# Patient Record
Sex: Male | Born: 1935 | Race: White | Hispanic: No | Smoking: Former smoker
Health system: Southern US, Community
[De-identification: ages and names within clinical notes are randomized; demographics above are authoritative.]

## PROBLEM LIST (undated history)

## (undated) DIAGNOSIS — C189 Malignant neoplasm of colon, unspecified: Secondary | ICD-10-CM

## (undated) DIAGNOSIS — K589 Irritable bowel syndrome without diarrhea: Secondary | ICD-10-CM

## (undated) DIAGNOSIS — L570 Actinic keratosis: Secondary | ICD-10-CM

## (undated) DIAGNOSIS — I10 Essential (primary) hypertension: Secondary | ICD-10-CM

## (undated) DIAGNOSIS — M542 Cervicalgia: Secondary | ICD-10-CM

## (undated) DIAGNOSIS — M722 Plantar fascial fibromatosis: Secondary | ICD-10-CM

## (undated) DIAGNOSIS — K219 Gastro-esophageal reflux disease without esophagitis: Secondary | ICD-10-CM

## (undated) HISTORY — PX: TONSILLECTOMY: SUR1361

## (undated) HISTORY — PX: CARPAL TUNNEL RELEASE: SHX101

## (undated) HISTORY — PX: APPENDECTOMY: SHX54

## (undated) HISTORY — PX: COLON SURGERY: SHX602

## (undated) HISTORY — PX: CHOLECYSTECTOMY: SHX55

## (undated) HISTORY — PX: COLON RESECTION: SHX5231

---

## 2007-12-21 ENCOUNTER — Observation Stay: Payer: Self-pay | Admitting: Internal Medicine

## 2007-12-21 ENCOUNTER — Other Ambulatory Visit: Payer: Self-pay

## 2009-01-03 ENCOUNTER — Ambulatory Visit: Payer: Self-pay | Admitting: Internal Medicine

## 2009-11-11 ENCOUNTER — Emergency Department: Payer: Self-pay | Admitting: Internal Medicine

## 2011-02-17 ENCOUNTER — Inpatient Hospital Stay: Payer: Self-pay | Admitting: Surgery

## 2011-03-27 ENCOUNTER — Emergency Department: Payer: Self-pay | Admitting: Internal Medicine

## 2011-12-23 ENCOUNTER — Ambulatory Visit: Payer: Self-pay | Admitting: Gastroenterology

## 2011-12-26 LAB — PATHOLOGY REPORT

## 2013-06-24 DIAGNOSIS — C44320 Squamous cell carcinoma of skin of unspecified parts of face: Secondary | ICD-10-CM | POA: Insufficient documentation

## 2014-01-03 ENCOUNTER — Observation Stay: Payer: Self-pay | Admitting: Internal Medicine

## 2014-01-03 LAB — CBC WITH DIFFERENTIAL/PLATELET
BASOS PCT: 0.7 %
Basophil #: 0 10*3/uL (ref 0.0–0.1)
EOS PCT: 3.9 %
Eosinophil #: 0.2 10*3/uL (ref 0.0–0.7)
HCT: 45.3 % (ref 40.0–52.0)
HGB: 15.2 g/dL (ref 13.0–18.0)
LYMPHS ABS: 1.8 10*3/uL (ref 1.0–3.6)
Lymphocyte %: 31.1 %
MCH: 33.4 pg (ref 26.0–34.0)
MCHC: 33.7 g/dL (ref 32.0–36.0)
MCV: 99 fL (ref 80–100)
Monocyte #: 0.5 x10 3/mm (ref 0.2–1.0)
Monocyte %: 8.8 %
Neutrophil #: 3.2 10*3/uL (ref 1.4–6.5)
Neutrophil %: 55.5 %
Platelet: 145 10*3/uL — ABNORMAL LOW (ref 150–440)
RBC: 4.57 10*6/uL (ref 4.40–5.90)
RDW: 13.3 % (ref 11.5–14.5)
WBC: 5.8 10*3/uL (ref 3.8–10.6)

## 2014-01-03 LAB — BASIC METABOLIC PANEL
Anion Gap: 7 (ref 7–16)
BUN: 13 mg/dL (ref 7–18)
Calcium, Total: 9.5 mg/dL (ref 8.5–10.1)
Chloride: 101 mmol/L (ref 98–107)
Co2: 26 mmol/L (ref 21–32)
Creatinine: 1.34 mg/dL — ABNORMAL HIGH (ref 0.60–1.30)
EGFR (African American): 54 — ABNORMAL LOW
GFR CALC NON AF AMER: 47 — AB
Glucose: 97 mg/dL (ref 65–99)
OSMOLALITY: 268 (ref 275–301)
Potassium: 4 mmol/L (ref 3.5–5.1)
Sodium: 134 mmol/L — ABNORMAL LOW (ref 136–145)

## 2014-01-03 LAB — URINALYSIS, COMPLETE
Bacteria: NONE SEEN
Bilirubin,UR: NEGATIVE
Blood: NEGATIVE
GLUCOSE, UR: NEGATIVE mg/dL (ref 0–75)
Ketone: NEGATIVE
Leukocyte Esterase: NEGATIVE
Nitrite: NEGATIVE
PROTEIN: NEGATIVE
Ph: 8 (ref 4.5–8.0)
SPECIFIC GRAVITY: 1.011 (ref 1.003–1.030)
Squamous Epithelial: <1
WBC UR: NONE SEEN /HPF (ref 0–5)

## 2014-01-03 LAB — TROPONIN I

## 2014-01-04 LAB — BASIC METABOLIC PANEL
Anion Gap: 6 — ABNORMAL LOW (ref 7–16)
BUN: 14 mg/dL (ref 7–18)
CALCIUM: 9.5 mg/dL (ref 8.5–10.1)
Chloride: 103 mmol/L (ref 98–107)
Co2: 26 mmol/L (ref 21–32)
Creatinine: 1.23 mg/dL (ref 0.60–1.30)
EGFR (Non-African Amer.): 52 — ABNORMAL LOW
GFR CALC AF AMER: 60 — AB
Glucose: 96 mg/dL (ref 65–99)
Osmolality: 270 (ref 275–301)
POTASSIUM: 4.1 mmol/L (ref 3.5–5.1)
Sodium: 135 mmol/L — ABNORMAL LOW (ref 136–145)

## 2014-01-04 LAB — CBC WITH DIFFERENTIAL/PLATELET
Basophil #: 0 10*3/uL (ref 0.0–0.1)
Basophil %: 0.5 %
Eosinophil #: 0.2 10*3/uL (ref 0.0–0.7)
Eosinophil %: 5.2 %
HCT: 42.4 % (ref 40.0–52.0)
HGB: 14.6 g/dL (ref 13.0–18.0)
Lymphocyte #: 1.4 10*3/uL (ref 1.0–3.6)
Lymphocyte %: 28.9 %
MCH: 34.1 pg — AB (ref 26.0–34.0)
MCHC: 34.5 g/dL (ref 32.0–36.0)
MCV: 99 fL (ref 80–100)
MONOS PCT: 9.5 %
Monocyte #: 0.4 x10 3/mm (ref 0.2–1.0)
Neutrophil #: 2.6 10*3/uL (ref 1.4–6.5)
Neutrophil %: 55.9 %
Platelet: 139 10*3/uL — ABNORMAL LOW (ref 150–440)
RBC: 4.29 10*6/uL — ABNORMAL LOW (ref 4.40–5.90)
RDW: 13.1 % (ref 11.5–14.5)
WBC: 4.7 10*3/uL (ref 3.8–10.6)

## 2014-01-04 LAB — LIPID PANEL
Cholesterol: 101 mg/dL (ref 0–200)
HDL: 21 mg/dL — AB (ref 40–60)
LDL CHOLESTEROL, CALC: 27 mg/dL (ref 0–100)
Triglycerides: 267 mg/dL — ABNORMAL HIGH (ref 0–200)
VLDL CHOLESTEROL, CALC: 53 mg/dL — AB (ref 5–40)

## 2014-01-04 LAB — TSH: Thyroid Stimulating Horm: 1.27 u[IU]/mL

## 2014-01-07 DIAGNOSIS — G459 Transient cerebral ischemic attack, unspecified: Secondary | ICD-10-CM | POA: Insufficient documentation

## 2014-01-30 ENCOUNTER — Emergency Department: Payer: Self-pay | Admitting: Internal Medicine

## 2014-01-30 LAB — COMPREHENSIVE METABOLIC PANEL
ALBUMIN: 3.2 g/dL — AB (ref 3.4–5.0)
ALK PHOS: 99 U/L
ALT: 23 U/L (ref 12–78)
Anion Gap: 6 — ABNORMAL LOW (ref 7–16)
BUN: 18 mg/dL (ref 7–18)
Bilirubin,Total: 0.5 mg/dL (ref 0.2–1.0)
CHLORIDE: 101 mmol/L (ref 98–107)
Calcium, Total: 9.4 mg/dL (ref 8.5–10.1)
Co2: 26 mmol/L (ref 21–32)
Creatinine: 1.28 mg/dL (ref 0.60–1.30)
EGFR (African American): 57 — ABNORMAL LOW
EGFR (Non-African Amer.): 49 — ABNORMAL LOW
Glucose: 104 mg/dL — ABNORMAL HIGH (ref 65–99)
OSMOLALITY: 269 (ref 275–301)
Potassium: 4.4 mmol/L (ref 3.5–5.1)
SGOT(AST): 22 U/L (ref 15–37)
SODIUM: 133 mmol/L — AB (ref 136–145)
Total Protein: 7.3 g/dL (ref 6.4–8.2)

## 2014-01-30 LAB — CBC
HCT: 42.3 % (ref 40.0–52.0)
HGB: 14.3 g/dL (ref 13.0–18.0)
MCH: 32.9 pg (ref 26.0–34.0)
MCHC: 33.8 g/dL (ref 32.0–36.0)
MCV: 97 fL (ref 80–100)
Platelet: 142 10*3/uL — ABNORMAL LOW (ref 150–440)
RBC: 4.35 10*6/uL — AB (ref 4.40–5.90)
RDW: 12.4 % (ref 11.5–14.5)
WBC: 5.4 10*3/uL (ref 3.8–10.6)

## 2014-01-30 LAB — TROPONIN I

## 2014-01-30 LAB — CK TOTAL AND CKMB (NOT AT ARMC)
CK, TOTAL: 47 U/L
CK-MB: 0.8 ng/mL (ref 0.5–3.6)

## 2014-02-21 DIAGNOSIS — R42 Dizziness and giddiness: Secondary | ICD-10-CM | POA: Insufficient documentation

## 2014-02-21 DIAGNOSIS — N289 Disorder of kidney and ureter, unspecified: Secondary | ICD-10-CM | POA: Insufficient documentation

## 2014-03-04 DIAGNOSIS — R251 Tremor, unspecified: Secondary | ICD-10-CM | POA: Insufficient documentation

## 2014-12-17 NOTE — H&P (Signed)
PATIENT NAME:  Kevin Rush, Kevin Rush MR#:  [id number] DATE OF BIRTH:  06/25/1923  DATE OF ADMISSION:  01/03/2014  PRIMARY CARE PHYSICIAN: Leonie Douglas. Doy Hutching, MD  REFERRING EMERGENCY ROOM PHYSICIAN: Verdia Kuba. Paduchowski, MD  CHIEF COMPLAINT:  Dizziness.   HISTORY OF PRESENT ILLNESS: This is 81 year old male with a past medical history of gastroesophageal reflux disease, colon cancer, status post colostomy, hypertension, who lives at a retirement community, [place], with his wife. Today, morning, he felt some dizzy when he woke up at around 5 or 6.  He was planning to get on the treadmill as per his schedule 3 times per week but because of this dizziness he just went back to bed and slept for maybe a few more hours. After that, he wanted to go to the bathroom so wife helped him to go to the bathroom because of the slight dizziness when he was trying to get up on the commode. He felt more dizzy and almost passed out and slumped to the floor. He did not fall down or hit his head anywhere. Wife was there accompanying him and he never lost his consciousness. He was alert and oriented and knowing where he is and what happened. On further questioning, he says that his head was feeling like spinning. He felt somewhat nauseated also at that time but denies any palpitation or chest pain. He denies any headache, numbness or weakness in the limbs. He did not have a similar episode in the past. He denies any feeling of diarrhea or dehydration on working out in the last 1 or 2 days. Brought to the Emergency Room. CT scan of the head was negative so referred her for admission to hospitalist team.   REVIEW OF SYSTEMS:    CONSTITUTIONAL: Negative for fever, fatigue, weakness, pain or weight loss.  EYES: No blurring, double vision, discharge or redness.  EARS, NOSE, THROAT: No tinnitus, ear pain or hearing loss.  RESPIRATORY: No cough, wheezing, hemoptysis or shortness of breath.  CARDIOVASCULAR: No chest pain,  orthopnea, edema, arrhythmia or palpitations.  GASTROINTESTINAL:  Felt some nausea but no vomiting, diarrhea, abdominal pain.  GENITOURINARY: No dysuria, dysuria, hematuria or increased frequency.  ENDOCRINE: No heat or cold intolerance. No increased sweating.  SKIN: No acne, rashes or lesions.  MUSCULOSKELETAL: No pain or swelling in the joints.  NEUROLOGICAL: No numbness, weakness, tremor but the patient had an episode of head spinning and near-syncopal episode.  PSYCHIATRIC: No anxiety, insomnia, bipolar disorder.   PAST MEDICAL HISTORY: 1.  Gastroesophageal reflux disease.  2.  Colon cancer, status post colostomy.  3.  Hypertension.   SOCIAL HISTORY:  1.  He has not smoked in more than 60 years in the past.  2.  Drinks alcohol regularly.  3.  Is retired, living with his wife in a retirement community at [place].   FAMILY HISTORY: Mother had intestinal cancer. No diabetes history in the family.   HOME MEDICATIONS: 1.  Prilosec 20 mg daily.  2.  Nortriptyline 25 mg at nighttime.  3.  Cholestyramine 1 packet daily.  4.  Metoprolol 25 mg tablet, take 1/2 tablet orally daily.  5.  Aspirin 81 mg p.o. daily.   PHYSICAL EXAMINATION: VITAL SIGNS: In ER, temperature 97.6, pulse 78, respirations 18, blood pressure 145/82, pulse oximetry is 96% on room air.  GENERAL: The patient is fully alert and oriented to time, place and person. Does not appear in any acute distress.  HEENT: Head and neck  atraumatic. Conjunctivae pink. Oral mucosa moist. Neck supple. Hearing aid present in bilateral ears. No JVD.  RESPIRATORY: Bilateral equal and clear air entry.  CARDIOVASCULAR: S1, S2 present, regular. No murmur.  ABDOMEN: Soft, nontender. Bowel sounds present. No organomegaly.  SKIN: No rashes.  LEGS:  No edema.  NEUROLOGICAL: Power 5 out of 5, follows commands, moves all 4 limbs.  PSYCHIATRIC: Does not appear in any acute psychiatric illness.  JOINTS: No swelling or tenderness.    LABORATORY, DIAGNOSTIC AND RADIOLOGICAL DATA:  Glucose 97, BUN is 13, creatine 1.34, sodium 134, potassium is 4.0, chloride is 101, CO2 is 26, calcium is 9.5. Troponin less than 0.02. WBC 5.8, hemoglobin 15.2, platelet count is 145, MCV is 99. CT scan of the head without contrast no acute intracranial abnormality.   ASSESSMENT AND PLAN: An 81 year old male with past medical history of gastroesophageal reflux disease, colon cancer and hypertension, came with a feeling of dizziness today morning. CT scan of the head is negative.  1.  Transient ischemic attack. Most likely this is a transient ischemic attack episode as currently the patient does not feel having any symptoms and on physical exam he does not have any findings. We will admit to medical floor with telemetry monitoring.  Get MRI of the brain and carotid Doppler study to rule out underlying cause and echocardiogram for any cardiac abnormalities.  2.  Hypertension. Meanwhile, we will continue metoprolol.  3.  Gastroesophageal reflux disease. Continue his medication as Prilosec, home.  4.  Slightly worsened renal function, creatinine 1.34.  Will give IV hydration and monitor it tomorrow.  7.  CODE STATUS: Full code.   TOTAL TIME SPENT ON THIS ADMISSION: 50 minutes.    ____________________________ Ceasar Lund Anselm Jungling, MD vgv:cs D: 01/03/2014 15:18:31 ET T: 01/03/2014 15:41:18 ET JOB#: [id number]  cc: Ceasar Lund. Anselm Jungling, MD, <Dictator> Leonie Douglas. Doy Hutching, MD Vaughan Basta MD ELECTRONICALLY SIGNED 01/18/2014 16:26

## 2014-12-19 ENCOUNTER — Emergency Department
Admit: 2014-12-19 | Disposition: A | Discharge status: Home / Self Care | Payer: Self-pay | Admitting: Emergency Medicine

## 2014-12-19 LAB — URINALYSIS, COMPLETE
BACTERIA: NONE SEEN
BLOOD: NEGATIVE
Bilirubin,UR: NEGATIVE
Glucose,UR: NEGATIVE mg/dL (ref 0–75)
Ketone: NEGATIVE
Leukocyte Esterase: NEGATIVE
Nitrite: NEGATIVE
PROTEIN: NEGATIVE
Ph: 8 (ref 4.5–8.0)
RBC,UR: NONE SEEN /HPF (ref 0–5)
SPECIFIC GRAVITY: 1.008 (ref 1.003–1.030)

## 2014-12-19 LAB — COMPREHENSIVE METABOLIC PANEL WITH GFR
Albumin: 4.1 g/dL
Alkaline Phosphatase: 72 U/L
Anion Gap: 9
BUN: 17 mg/dL
Bilirubin,Total: 0.8 mg/dL
Calcium, Total: 10.2 mg/dL
Chloride: 103 mmol/L
Co2: 25 mmol/L
Creatinine: 1.24 mg/dL
EGFR (African American): 59 — ABNORMAL LOW
EGFR (Non-African Amer.): 51 — ABNORMAL LOW
Glucose: 106 mg/dL — ABNORMAL HIGH
Potassium: 4.2 mmol/L
SGOT(AST): 24 U/L
SGPT (ALT): 17 U/L
Sodium: 137 mmol/L
Total Protein: 7.5 g/dL

## 2014-12-19 LAB — PROTIME-INR
INR: 1
Prothrombin Time: 13 secs

## 2014-12-19 LAB — CBC
HCT: 41.8 %
HGB: 14.4 g/dL
MCH: 34.2 pg — ABNORMAL HIGH
MCHC: 34.4 g/dL
MCV: 99 fL
Platelet: 159 10*3/uL
RBC: 4.22 x10 6/mm 3 — ABNORMAL LOW
RDW: 12.9 %
WBC: 5.7 10*3/uL

## 2014-12-19 LAB — APTT: ACTIVATED PTT: 34.6 s (ref 23.6–35.9)

## 2014-12-19 LAB — TROPONIN I: Troponin-I: <0.03 ng/mL

## 2015-01-17 ENCOUNTER — Other Ambulatory Visit: Payer: Self-pay | Admitting: Ophthalmology

## 2015-01-17 DIAGNOSIS — H4710 Unspecified papilledema: Secondary | ICD-10-CM

## 2015-01-26 ENCOUNTER — Ambulatory Visit: Admission: RE | Admit: 2015-01-26 | Payer: Medicare Other | Source: Ambulatory Visit

## 2015-01-26 ENCOUNTER — Ambulatory Visit
Admission: RE | Admit: 2015-01-26 | Discharge: 2015-01-26 | Disposition: A | Discharge status: Home / Self Care | Payer: Medicare Other | Source: Ambulatory Visit | Attending: Ophthalmology | Admitting: Ophthalmology

## 2015-01-26 DIAGNOSIS — H47092 Other disorders of optic nerve, not elsewhere classified, left eye: Secondary | ICD-10-CM | POA: Diagnosis not present

## 2015-01-26 DIAGNOSIS — H4710 Unspecified papilledema: Secondary | ICD-10-CM | POA: Insufficient documentation

## 2015-01-26 DIAGNOSIS — H547 Unspecified visual loss: Secondary | ICD-10-CM | POA: Insufficient documentation

## 2015-01-26 MED ORDER — GADOBENATE DIMEGLUMINE 529 MG/ML IV SOLN
15.0000 mL | Freq: Once | INTRAVENOUS | Status: AC | PRN
  Administered 2015-01-26: 13 mL via INTRAVENOUS

## 2015-01-27 ENCOUNTER — Other Ambulatory Visit
Admission: RE | Admit: 2015-01-27 | Discharge: 2015-01-27 | Disposition: A | Discharge status: Home / Self Care | Payer: Medicare Other | Source: Ambulatory Visit | Attending: Ophthalmology | Admitting: Ophthalmology

## 2015-01-27 DIAGNOSIS — H4710 Unspecified papilledema: Secondary | ICD-10-CM | POA: Diagnosis present

## 2015-01-27 LAB — CBC WITH DIFFERENTIAL/PLATELET
Basophils Absolute: 0 10*3/uL (ref 0–0.1)
Basophils Relative: 1 %
EOS ABS: 0.2 10*3/uL (ref 0–0.7)
EOS PCT: 5 %
HEMATOCRIT: 37.1 % — AB (ref 40.0–52.0)
HEMOGLOBIN: 12.4 g/dL — AB (ref 13.0–18.0)
Lymphocytes Relative: 27 %
Lymphs Abs: 1.4 10*3/uL (ref 1.0–3.6)
MCH: 33.8 pg (ref 26.0–34.0)
MCHC: 33.5 g/dL (ref 32.0–36.0)
MCV: 100.8 fL — ABNORMAL HIGH (ref 80.0–100.0)
Monocytes Absolute: 0.4 10*3/uL (ref 0.2–1.0)
Monocytes Relative: 8 %
NEUTROS PCT: 59 %
Neutro Abs: 3 10*3/uL (ref 1.4–6.5)
Platelets: 147 10*3/uL — ABNORMAL LOW (ref 150–440)
RBC: 3.68 MIL/uL — ABNORMAL LOW (ref 4.40–5.90)
RDW: 13.4 % (ref 11.5–14.5)
WBC: 5.1 10*3/uL (ref 3.8–10.6)

## 2015-01-27 LAB — SEDIMENTATION RATE: SED RATE: 27 mm/h — AB (ref 0–20)

## 2015-01-27 LAB — C-REACTIVE PROTEIN: CRP: 0.8 mg/dL (ref <1.0)

## 2015-02-21 DIAGNOSIS — E538 Deficiency of other specified B group vitamins: Secondary | ICD-10-CM | POA: Insufficient documentation

## 2015-03-15 ENCOUNTER — Other Ambulatory Visit
Admission: RE | Admit: 2015-03-15 | Discharge: 2015-03-15 | Disposition: A | Discharge status: Home / Self Care | Payer: Medicare Other | Source: Ambulatory Visit | Attending: Ophthalmology | Admitting: Ophthalmology

## 2015-03-15 DIAGNOSIS — H469 Unspecified optic neuritis: Secondary | ICD-10-CM | POA: Diagnosis present

## 2015-03-15 LAB — CBC WITH DIFFERENTIAL/PLATELET
BASOS ABS: 0 10*3/uL (ref 0–0.1)
Basophils Relative: 1 %
EOS ABS: 0.2 10*3/uL (ref 0–0.7)
EOS PCT: 4 %
HCT: 38.8 % — ABNORMAL LOW (ref 40.0–52.0)
HEMOGLOBIN: 13.2 g/dL (ref 13.0–18.0)
LYMPHS ABS: 1.4 10*3/uL (ref 1.0–3.6)
LYMPHS PCT: 25 %
MCH: 33.6 pg (ref 26.0–34.0)
MCHC: 34.1 g/dL (ref 32.0–36.0)
MCV: 98.5 fL (ref 80.0–100.0)
Monocytes Absolute: 0.4 10*3/uL (ref 0.2–1.0)
Monocytes Relative: 7 %
NEUTROS PCT: 63 %
Neutro Abs: 3.5 10*3/uL (ref 1.4–6.5)
PLATELETS: 149 10*3/uL — AB (ref 150–440)
RBC: 3.94 MIL/uL — ABNORMAL LOW (ref 4.40–5.90)
RDW: 12.6 % (ref 11.5–14.5)
WBC: 5.5 10*3/uL (ref 3.8–10.6)

## 2015-03-15 LAB — SEDIMENTATION RATE: SED RATE: 20 mm/h (ref 0–20)

## 2015-03-15 LAB — C-REACTIVE PROTEIN: CRP: 0.7 mg/dL (ref <1.0)

## 2015-06-14 DIAGNOSIS — H469 Unspecified optic neuritis: Secondary | ICD-10-CM | POA: Insufficient documentation

## 2015-09-25 ENCOUNTER — Encounter: Payer: Self-pay | Admitting: Emergency Medicine

## 2015-09-25 ENCOUNTER — Emergency Department: Payer: Medicare Other

## 2015-09-25 ENCOUNTER — Inpatient Hospital Stay: Payer: Medicare Other

## 2015-09-25 ENCOUNTER — Inpatient Hospital Stay
Admission: EM | Admit: 2015-09-25 | Discharge: 2015-09-27 | DRG: 202 | Disposition: A | Discharge status: Home / Self Care | Payer: Medicare Other | Attending: Internal Medicine | Admitting: Internal Medicine

## 2015-09-25 DIAGNOSIS — Z7982 Long term (current) use of aspirin: Secondary | ICD-10-CM

## 2015-09-25 DIAGNOSIS — J45909 Unspecified asthma, uncomplicated: Secondary | ICD-10-CM | POA: Diagnosis present

## 2015-09-25 DIAGNOSIS — J8410 Pulmonary fibrosis, unspecified: Secondary | ICD-10-CM | POA: Diagnosis present

## 2015-09-25 DIAGNOSIS — R0602 Shortness of breath: Secondary | ICD-10-CM

## 2015-09-25 DIAGNOSIS — Z8701 Personal history of pneumonia (recurrent): Secondary | ICD-10-CM | POA: Diagnosis not present

## 2015-09-25 DIAGNOSIS — Z800 Family history of malignant neoplasm of digestive organs: Secondary | ICD-10-CM | POA: Diagnosis not present

## 2015-09-25 DIAGNOSIS — Z87891 Personal history of nicotine dependence: Secondary | ICD-10-CM | POA: Diagnosis not present

## 2015-09-25 DIAGNOSIS — J209 Acute bronchitis, unspecified: Secondary | ICD-10-CM | POA: Diagnosis present

## 2015-09-25 DIAGNOSIS — F0390 Unspecified dementia without behavioral disturbance: Secondary | ICD-10-CM | POA: Diagnosis present

## 2015-09-25 DIAGNOSIS — I10 Essential (primary) hypertension: Secondary | ICD-10-CM | POA: Diagnosis present

## 2015-09-25 DIAGNOSIS — Z79899 Other long term (current) drug therapy: Secondary | ICD-10-CM

## 2015-09-25 DIAGNOSIS — K219 Gastro-esophageal reflux disease without esophagitis: Secondary | ICD-10-CM | POA: Diagnosis present

## 2015-09-25 DIAGNOSIS — Z85038 Personal history of other malignant neoplasm of large intestine: Secondary | ICD-10-CM

## 2015-09-25 DIAGNOSIS — N179 Acute kidney failure, unspecified: Secondary | ICD-10-CM | POA: Diagnosis present

## 2015-09-25 DIAGNOSIS — J40 Bronchitis, not specified as acute or chronic: Secondary | ICD-10-CM | POA: Diagnosis present

## 2015-09-25 DIAGNOSIS — E871 Hypo-osmolality and hyponatremia: Secondary | ICD-10-CM | POA: Diagnosis present

## 2015-09-25 DIAGNOSIS — K589 Irritable bowel syndrome without diarrhea: Secondary | ICD-10-CM | POA: Diagnosis present

## 2015-09-25 DIAGNOSIS — Z23 Encounter for immunization: Secondary | ICD-10-CM

## 2015-09-25 DIAGNOSIS — Z888 Allergy status to other drugs, medicaments and biological substances status: Secondary | ICD-10-CM

## 2015-09-25 DIAGNOSIS — Z9049 Acquired absence of other specified parts of digestive tract: Secondary | ICD-10-CM | POA: Diagnosis not present

## 2015-09-25 DIAGNOSIS — R000 Tachycardia, unspecified: Secondary | ICD-10-CM

## 2015-09-25 HISTORY — DX: Cervicalgia: M54.2

## 2015-09-25 HISTORY — DX: Gastro-esophageal reflux disease without esophagitis: K21.9

## 2015-09-25 HISTORY — DX: Irritable bowel syndrome, unspecified: K58.9

## 2015-09-25 HISTORY — DX: Malignant neoplasm of colon, unspecified: C18.9

## 2015-09-25 HISTORY — DX: Actinic keratosis: L57.0

## 2015-09-25 HISTORY — DX: Plantar fascial fibromatosis: M72.2

## 2015-09-25 HISTORY — DX: Essential (primary) hypertension: I10

## 2015-09-25 LAB — URINALYSIS COMPLETE WITH MICROSCOPIC (ARMC ONLY)
BILIRUBIN URINE: NEGATIVE
Bacteria, UA: NONE SEEN
Glucose, UA: NEGATIVE mg/dL
Hgb urine dipstick: NEGATIVE
Leukocytes, UA: NEGATIVE
NITRITE: NEGATIVE
PH: 5 (ref 5.0–8.0)
Protein, ur: 30 mg/dL — AB
Specific Gravity, Urine: 1.015 (ref 1.005–1.030)
Squamous Epithelial / LPF: NONE SEEN

## 2015-09-25 LAB — BASIC METABOLIC PANEL
Anion gap: 11 (ref 5–15)
BUN: 24 mg/dL — ABNORMAL HIGH (ref 6–20)
CALCIUM: 9.7 mg/dL (ref 8.9–10.3)
CO2: 21 mmol/L — ABNORMAL LOW (ref 22–32)
Chloride: 98 mmol/L — ABNORMAL LOW (ref 101–111)
Creatinine, Ser: 1.79 mg/dL — ABNORMAL HIGH (ref 0.61–1.24)
GFR, EST AFRICAN AMERICAN: 37 mL/min — AB (ref >60)
GFR, EST NON AFRICAN AMERICAN: 32 mL/min — AB (ref >60)
GLUCOSE: 133 mg/dL — AB (ref 65–99)
Potassium: 3.9 mmol/L (ref 3.5–5.1)
Sodium: 130 mmol/L — ABNORMAL LOW (ref 135–145)

## 2015-09-25 LAB — CBC
HCT: 45.2 % (ref 40.0–52.0)
Hemoglobin: 15.1 g/dL (ref 13.0–18.0)
MCH: 32.2 pg (ref 26.0–34.0)
MCHC: 33.5 g/dL (ref 32.0–36.0)
MCV: 96 fL (ref 80.0–100.0)
Platelets: 154 10*3/uL (ref 150–440)
RBC: 4.71 MIL/uL (ref 4.40–5.90)
RDW: 13.5 % (ref 11.5–14.5)
WBC: 5.3 10*3/uL (ref 3.8–10.6)

## 2015-09-25 LAB — TROPONIN I
Troponin I: 0.03 ng/mL (ref <0.031)
Troponin I: 0.03 ng/mL (ref <0.031)

## 2015-09-25 LAB — FIBRIN DERIVATIVES D-DIMER (ARMC ONLY): Fibrin derivatives D-dimer (ARMC): 1020 — ABNORMAL HIGH (ref 0–499)

## 2015-09-25 MED ORDER — LEVOFLOXACIN IN D5W 500 MG/100ML IV SOLN
500.0000 mg | Freq: Once | INTRAVENOUS | Status: AC
  Administered 2015-09-25: 500 mg via INTRAVENOUS
  Filled 2015-09-25: qty 100

## 2015-09-25 MED ORDER — SODIUM CHLORIDE 0.9 % IV BOLUS (SEPSIS)
1000.0000 mL | Freq: Once | INTRAVENOUS | Status: AC
  Administered 2015-09-25: 1000 mL via INTRAVENOUS

## 2015-09-25 MED ORDER — PNEUMOCOCCAL VAC POLYVALENT 25 MCG/0.5ML IJ INJ
0.5000 mL | INJECTION | INTRAMUSCULAR | Status: AC
  Administered 2015-09-27: 0.5 mL via INTRAMUSCULAR
  Filled 2015-09-25 (×2): qty 0.5

## 2015-09-25 MED ORDER — LEVOFLOXACIN IN D5W 250 MG/50ML IV SOLN
250.0000 mg | INTRAVENOUS | Status: DC
  Filled 2015-09-25: qty 50

## 2015-09-25 MED ORDER — ENOXAPARIN SODIUM 30 MG/0.3ML ~~LOC~~ SOLN
30.0000 mg | SUBCUTANEOUS | Status: DC
  Administered 2015-09-25 – 2015-09-26 (×2): 30 mg via SUBCUTANEOUS
  Filled 2015-09-25 (×2): qty 0.3

## 2015-09-25 MED ORDER — FUROSEMIDE 10 MG/ML IJ SOLN
40.0000 mg | INTRAMUSCULAR | Status: AC
  Administered 2015-09-25: 40 mg via INTRAVENOUS
  Filled 2015-09-25: qty 4

## 2015-09-25 MED ORDER — FUROSEMIDE 10 MG/ML IJ SOLN: 40.0000 mg | Freq: Every day | INTRAMUSCULAR | Status: DC

## 2015-09-25 MED ORDER — ASPIRIN EC 325 MG PO TBEC
325.0000 mg | DELAYED_RELEASE_TABLET | Freq: Every day | ORAL | Status: DC
  Administered 2015-09-25 – 2015-09-27 (×3): 325 mg via ORAL
  Filled 2015-09-25 (×3): qty 1

## 2015-09-25 MED ORDER — FLUDROCORTISONE ACETATE 0.1 MG PO TABS
0.1000 mg | ORAL_TABLET | Freq: Every day | ORAL | Status: DC
  Administered 2015-09-26 – 2015-09-27 (×2): 0.1 mg via ORAL
  Filled 2015-09-25 (×3): qty 1

## 2015-09-25 MED ORDER — IPRATROPIUM-ALBUTEROL 0.5-2.5 (3) MG/3ML IN SOLN
3.0000 mL | RESPIRATORY_TRACT | Status: DC
  Administered 2015-09-26 (×3): 3 mL via RESPIRATORY_TRACT
  Filled 2015-09-25 (×3): qty 3

## 2015-09-25 MED ORDER — PANTOPRAZOLE SODIUM 40 MG PO TBEC
40.0000 mg | DELAYED_RELEASE_TABLET | Freq: Two times a day (BID) | ORAL | Status: DC
Start: 2015-09-25 — End: 2015-09-27
  Administered 2015-09-25 – 2015-09-27 (×4): 40 mg via ORAL
  Filled 2015-09-25 (×4): qty 1

## 2015-09-25 MED ORDER — ONDANSETRON HCL 4 MG/2ML IJ SOLN: 4.0000 mg | Freq: Four times a day (QID) | INTRAMUSCULAR | Status: DC | PRN

## 2015-09-25 MED ORDER — ADULT MULTIVITAMIN W/MINERALS CH
1.0000 | ORAL_TABLET | Freq: Every day | ORAL | Status: DC
  Administered 2015-09-25 – 2015-09-27 (×3): 1 via ORAL
  Filled 2015-09-25 (×5): qty 1

## 2015-09-25 MED ORDER — ACETAMINOPHEN 325 MG PO TABS: 650.0000 mg | ORAL_TABLET | Freq: Four times a day (QID) | ORAL | Status: DC | PRN

## 2015-09-25 MED ORDER — POLYETHYLENE GLYCOL 3350 17 G PO PACK: 17.0000 g | PACK | Freq: Every day | ORAL | Status: DC | PRN

## 2015-09-25 MED ORDER — ACETAMINOPHEN 650 MG RE SUPP: 650.0000 mg | Freq: Four times a day (QID) | RECTAL | Status: DC | PRN

## 2015-09-25 MED ORDER — TECHNETIUM TO 99M ALBUMIN AGGREGATED
4.0660 | Freq: Once | INTRAVENOUS | Status: AC | PRN
  Administered 2015-09-25: 4.066 via INTRAVENOUS

## 2015-09-25 MED ORDER — METHYLPREDNISOLONE SODIUM SUCC 40 MG IJ SOLR
40.0000 mg | Freq: Two times a day (BID) | INTRAMUSCULAR | Status: DC
  Administered 2015-09-25 – 2015-09-27 (×4): 40 mg via INTRAVENOUS
  Filled 2015-09-25 (×4): qty 1

## 2015-09-25 MED ORDER — METOPROLOL SUCCINATE ER 25 MG PO TB24
12.5000 mg | ORAL_TABLET | Freq: Every day | ORAL | Status: DC
  Administered 2015-09-25 – 2015-09-27 (×3): 12.5 mg via ORAL
  Filled 2015-09-25 (×4): qty 1

## 2015-09-25 MED ORDER — CHOLESTYRAMINE 4 G PO PACK
4.0000 g | PACK | Freq: Every day | ORAL | Status: DC
  Administered 2015-09-26 – 2015-09-27 (×2): 4 g via ORAL
  Filled 2015-09-25 (×2): qty 1

## 2015-09-25 MED ORDER — ONDANSETRON HCL 4 MG PO TABS: 4.0000 mg | ORAL_TABLET | Freq: Four times a day (QID) | ORAL | Status: DC | PRN

## 2015-09-25 MED ORDER — TECHNETIUM TC 99M DIETHYLENETRIAME-PENTAACETIC ACID
31.7000 | Freq: Once | INTRAVENOUS | Status: AC | PRN
  Administered 2015-09-25: 31.7 via INTRAVENOUS

## 2015-09-25 MED ORDER — SODIUM CHLORIDE 0.9% FLUSH
3.0000 mL | Freq: Two times a day (BID) | INTRAVENOUS | Status: DC
  Administered 2015-09-25 – 2015-09-27 (×3): 3 mL via INTRAVENOUS

## 2015-09-25 MED ORDER — NORTRIPTYLINE HCL 25 MG PO CAPS
25.0000 mg | ORAL_CAPSULE | Freq: Every day | ORAL | Status: DC
  Filled 2015-09-25 (×2): qty 1

## 2015-09-25 MED ORDER — TIMOLOL MALEATE 0.5 % OP SOLN
1.0000 [drp] | Freq: Two times a day (BID) | OPHTHALMIC | Status: DC
  Administered 2015-09-25 – 2015-09-27 (×3): 1 [drp] via OPHTHALMIC
  Filled 2015-09-25 (×2): qty 5

## 2015-09-25 NOTE — ED Notes (Signed)
Patient transported to X-ray 

## 2015-09-25 NOTE — ED Notes (Signed)
MD at bedside. 

## 2015-09-25 NOTE — ED Notes (Signed)
Patient transported to Ultrasound 

## 2015-09-25 NOTE — ED Provider Notes (Signed)
[facility] Emergency Department Provider Note    ____________________________________________  Time seen: ~1240  I have reviewed the triage vital signs and the nursing notes.   HISTORY  Chief Complaint Nausea   History limited by: Not Limited   HPI Kevin Rush is a 82 y.o. male who presents to the emergency department today because of concerns for fatigue, nausea and diarrhea. Patient states that he has been feeling fatiguedfor the past roughly week. He states that a has not done any extreme exertion or does he have any other explanation for the fatigue. He states that he also has had multiple episodes of diarrhea. Last episode of diarrhea was this morning. He describes it as watery. He did not notice any blood in it. The patient had a syncopal episode last night when he went to use the bathroom. He denied any associated chest pain or palpitations. He denies any fevers.     Past Medical History  Diagnosis Date  . Colon cancer ([place])   . Hypertension     There are no active problems to display for this patient.   History reviewed. No pertinent past surgical history.  No current outpatient prescriptions on file.  Allergies Zithromax  No family history on file.  Social History Social History  Substance Use Topics  . Smoking status: Never Smoker   . Smokeless tobacco: None  . Alcohol Use: No    Review of Systems  Constitutional: Negative for fever. Cardiovascular: Negative for chest pain. Respiratory: Negative for shortness of breath. Gastrointestinal: Negative for abdominal pain, vomiting and diarrhea. Genitourinary: Negative for dysuria. Neurological: Negative for headaches, focal weakness or numbness.  10-point ROS otherwise negative.  ____________________________________________   PHYSICAL EXAM:  VITAL SIGNS: ED Triage Vitals  Enc Vitals Group     BP 09/25/15 1025 91/61 mmHg     Pulse Rate 09/25/15 1025 106     Resp  09/25/15 1025 18     Temp 09/25/15 1025 98.1 F (36.7 C)     Temp Source 09/25/15 1025 Oral     SpO2 09/25/15 1025 97 %     Weight 09/25/15 1025 138 lb (62.596 kg)     Height 09/25/15 1025 5\' 5"  (1.651 m)     Head Cir --      Peak Flow --      Pain Score 09/25/15 1042 0   Constitutional: Alert and oriented. Well appearing and in no distress. Eyes: Conjunctivae are normal. PERRL. Normal extraocular movements. ENT   Head: Normocephalic and atraumatic.   Nose: No congestion/rhinnorhea.   Mouth/Throat: Mucous membranes are moist.   Neck: No stridor. Hematological/Lymphatic/Immunilogical: No cervical lymphadenopathy. Cardiovascular: Normal rate, regular rhythm.  No murmurs, rubs, or gallops. Respiratory: Normal respiratory effort without tachypnea nor retractions. Breath sounds are clear and equal bilaterally. No wheezes/rales/rhonchi. Gastrointestinal: Soft and nontender. No distention. There is no CVA tenderness. Genitourinary: Deferred Musculoskeletal: Normal range of motion in all extremities. No joint effusions.  No lower extremity tenderness nor edema. Neurologic:  Normal speech and language. No gross focal neurologic deficits are appreciated.  Skin:  Skin is warm, dry and intact. No rash noted. Psychiatric: Mood and affect are normal. Speech and behavior are normal. Patient exhibits appropriate insight and judgment.  ____________________________________________    LABS (pertinent positives/negatives)  Trop 0.03 D-Dimer 1020 WBC 5.3 Na 130 Cr 1.79   ____________________________________________   EKG  I, Nance Pear, attending physician, personally viewed and interpreted this EKG  EKG Time: 1029 Rate: 114  Rhythm: sinus tachycardia Axis: normal Intervals: qtc 443 QRS: narrow ST changes: no st elevation Impression: abnormal ekg   ____________________________________________    RADIOLOGY  CXR  IMPRESSION: 1. Lung findings most suggestive  of chronic interstitial lung disease/fibrosis. Without prior exams for comparison, I cannot exclude some degree of superimposed interstitial edema. 2. No evidence of pneumonia. 3. Lungs mildly hyperexpanded suggesting COPD. Suspect associated chronic bronchitic changes centrally.   ____________________________________________   PROCEDURES  Procedure(s) performed: None  Critical Care performed: No  ____________________________________________   INITIAL IMPRESSION / ASSESSMENT AND PLAN / ED COURSE  Pertinent labs & imaging results that were available during my care of the patient were reviewed by me and considered in my medical decision making (see chart for details).  Patient presented to the emergency department for a couple day history of feeling unwell. On exam here patient tachycardic. Patient does have a history of cancer. D-dimer was sent is elevated raising concerns for pulmonary embolism. Patient was also given a liter of fluid given tachycardia however this did not improve. Will plan on admission to the hospital service for further workup and evaluation.  ____________________________________________   FINAL CLINICAL IMPRESSION(S) / ED DIAGNOSES  Final diagnoses:  Shortness of breath  Tachycardia     Nance Pear, MD 09/26/15 1425

## 2015-09-25 NOTE — ED Notes (Signed)
Attempted to call report to 1C; Network engineer states that charge nurse is in a pt room at this time.

## 2015-09-25 NOTE — Progress Notes (Signed)
Pharmacy Antibiotic Note  Kevin Rush is a 82 y.o. male admitted on 09/25/2015 with acute bronchitis.  Pharmacy has been consulted for levofloxacin dosing.  Plan: Levofloxacin 500 mg IV x 1 followed by levofloxacin 250 mg IV Q24H. Pharmacy will continue to follow for IV to PO conversion and to recommend appropriate duration based on clinical response.   Height: 5\' 5"  (165.1 cm) Weight: 138 lb (62.596 kg) IBW/kg (Calculated) : 61.5  Temp (24hrs), Avg:98.3 F (36.8 C), Min:98.1 F (36.7 C), Max:98.4 F (36.9 C)   Recent Labs Lab 09/25/15 1045  WBC 5.3  CREATININE 1.79*    Estimated Creatinine Clearance: 23.9 mL/min (by C-G formula based on Cr of 1.79).    Allergies  Allergen Reactions  . Zithromax [Azithromycin] Anaphylaxis    Thank you for allowing pharmacy to be a part of this patient's care.  Laural Benes, Pharm.D., BCPS Clinical Pharmacist 09/25/2015 9:50 PM

## 2015-09-25 NOTE — ED Notes (Signed)
Pt unable to void at this moment. RN notified of this.

## 2015-09-25 NOTE — H&P (Signed)
[facility] [place] NAME: Kevin Rush    MR#:  [id number]  DATE OF BIRTH:  [birth year]  DATE OF ADMISSION:  09/25/2015  PRIMARY CARE PHYSICIAN: Idelle Crouch, MD   REQUESTING/REFERRING PHYSICIAN: Dr. Delman Kitten  CHIEF COMPLAINT:   Chief Complaint  Patient presents with  . Nausea    HISTORY OF PRESENT ILLNESS:  Kevin Rush  is a 82 y.o. male with a known history of colon cancer in remission, hypertension, GERD who lives at assisted living facility came in after presyncopal episode and weakness and cough. Patient is normally active at baseline, but for the last 3-4 days he's been having extreme weakness, decreased intake due to nausea. Last night he had a presyncopal episode. Denies any chest pain or lightheadedness prior to the episode. This morning he was feeling extremely weak, was having some cough and chills at home so presented to the emergency room. Started to be hyponatremic, mild renal insufficiency and chest x-ray with significant bronchitis changes noted.  PAST MEDICAL HISTORY:   Past Medical History  Diagnosis Date  . Colon cancer ([place])     in remission  . Hypertension   . Actinic keratoses   . GERD (gastroesophageal reflux disease)   . Irritable bowel syndrome   . Cervicalgia   . Plantar fibromatosis     PAST SURGICAL HISTORY:   Past Surgical History  Procedure Laterality Date  . Colon resection    . Appendectomy    . Cholecystectomy      SOCIAL HISTORY:   Social History  Substance Use Topics  . Smoking status: Never Smoker   . Smokeless tobacco: Not on file  . Alcohol Use: 0.0 oz/week    0 Standard drinks or equivalent per week     Comment: drinks canadian whisky every night- none in the last 3 days    FAMILY HISTORY:   Family History  Problem Relation Age of Onset  . Colon cancer Mother     DRUG ALLERGIES:   Allergies  Allergen Reactions  . Zithromax [Azithromycin] Anaphylaxis     REVIEW OF SYSTEMS:   Review of Systems  Constitutional: Positive for chills and malaise/fatigue. Negative for fever and weight loss.  HENT: Negative for ear discharge, ear pain, hearing loss and nosebleeds.   Eyes: Negative for blurred vision, double vision and photophobia.  Respiratory: Positive for shortness of breath. Negative for cough, hemoptysis and wheezing.   Cardiovascular: Negative for chest pain, palpitations, orthopnea and leg swelling.  Gastrointestinal: Positive for nausea. Negative for heartburn, vomiting, abdominal pain, diarrhea, constipation and melena.  Genitourinary: Negative for dysuria, urgency, frequency and hematuria.  Musculoskeletal: Negative for myalgias, back pain and neck pain.  Skin: Negative for rash.  Neurological: Negative for dizziness, tremors, sensory change, speech change, focal weakness and headaches.  Endo/Heme/Allergies: Does not bruise/bleed easily.  Psychiatric/Behavioral: Negative for depression.    MEDICATIONS AT HOME:   Prior to Admission medications   Medication Sig Start Date End Date Taking? Authorizing Provider  aspirin EC 325 MG tablet Take 325 mg by mouth daily.   Yes Historical Provider, MD  cholestyramine (QUESTRAN) 4 g packet Take 4 g by mouth daily before breakfast.    Yes Historical Provider, MD  cyanocobalamin (,VITAMIN B-12,) 1000 MCG/ML injection Inject 1,000 mcg into the muscle every 30 (thirty) days. Pt gets on the first of every month.   Yes Historical Provider, MD  fludrocortisone (FLORINEF) 0.1 MG tablet Take 0.1 mg by mouth  daily.   Yes Historical Provider, MD  metoprolol succinate (TOPROL-XL) 25 MG 24 hr tablet Take 12.5 mg by mouth daily.   Yes Historical Provider, MD  Multiple Vitamin (MULTIVITAMIN WITH MINERALS) TABS tablet Take 1 tablet by mouth daily.   Yes Historical Provider, MD  nortriptyline (PAMELOR) 25 MG capsule Take 25 mg by mouth at bedtime.   Yes Historical Provider, MD  omeprazole (PRILOSEC) 20 MG  capsule Take 20 mg by mouth 2 (two) times daily.   Yes Historical Provider, MD  timolol (TIMOPTIC) 0.5 % ophthalmic solution Place 1 drop into both eyes 2 (two) times daily.   Yes Historical Provider, MD      VITAL SIGNS:  Blood pressure 138/88, pulse 105, temperature 98.1 F (36.7 C), temperature source Oral, resp. rate 20, height 5\' 5"  (1.651 m), weight 62.596 kg (138 lb), SpO2 97 %.  PHYSICAL EXAMINATION:   Physical Exam  GENERAL:  82 y.o.-year-old patient lying in the bed with no acute distress.  EYES: Pupils equal, round, reactive to light and accommodation. No scleral icterus. Extraocular muscles intact.  HEENT: Head atraumatic, normocephalic. Oropharynx and nasopharynx clear.  NECK:  Supple, no jugular venous distention. No thyroid enlargement, no tenderness.  LUNGS: Decreased air entry both sides, scattered wheezes heard. Diffuse Rales worse on the right base noted. No use of accessory muscles of respiration.  CARDIOVASCULAR: S1, S2 normal. No rubs, or gallops. 3/6 systolic murmur is present ABDOMEN: Soft, nontender, nondistended. Bowel sounds present. No organomegaly or mass.  EXTREMITIES: No pedal edema, cyanosis, or clubbing.  NEUROLOGIC: Cranial nerves II through XII are intact. Muscle strength 5/5 in all extremities. Sensation intact. Gait not checked. Generalized weakness noted PSYCHIATRIC: The patient is alert and oriented x 3.  SKIN: No obvious rash, lesion, or ulcer.   LABORATORY PANEL:   CBC  Recent Labs Lab 09/25/15 1045  WBC 5.3  HGB 15.1  HCT 45.2  PLT 154   ------------------------------------------------------------------------------------------------------------------  Chemistries   Recent Labs Lab 09/25/15 1045  NA 130*  K 3.9  CL 98*  CO2 21*  GLUCOSE 133*  BUN 24*  CREATININE 1.79*  CALCIUM 9.7   ------------------------------------------------------------------------------------------------------------------  Cardiac Enzymes  Recent  Labs Lab 09/25/15 1045  TROPONINI 0.03   ------------------------------------------------------------------------------------------------------------------  RADIOLOGY:  Dg Chest 2 View  09/25/2015  CLINICAL DATA:  Tachycardia and shortness of breath, chronic. History of pneumonia, hypertension, colon cancer. Former smoker. EXAM: CHEST  2 VIEW COMPARISON:  None. FINDINGS: Heart size is upper normal. Lungs at least mildly hyperexpanded suggesting underlying COPD. Coarse interstitial markings throughout both lungs are most likely related to chronic interstitial fibrosis, less likely interstitial edema. Suspect associated chronic bronchitic changes centrally. No confluent opacity to suggest a developing pneumonia. No pleural effusion. No pneumothorax. Mild degenerative change noted throughout the slightly kyphotic thoracic spine. No acute osseous abnormality seen. IMPRESSION: 1. Lung findings most suggestive of chronic interstitial lung disease/fibrosis. Without prior exams for comparison, I cannot exclude some degree of superimposed interstitial edema. 2. No evidence of pneumonia. 3. Lungs mildly hyperexpanded suggesting COPD. Suspect associated chronic bronchitic changes centrally. Electronically Signed   By: Franki Cabot M.D.   On: 09/25/2015 15:21    EKG:   Orders placed or performed during the hospital encounter of 09/25/15  . EKG 12-Lead  . EKG 12-Lead  . ED EKG  . ED EKG    IMPRESSION AND PLAN:   Kevin Rush  is a 82 y.o. male with a known history of colon cancer in remission,  hypertension, GERD who lives at assisted living facility came in after presyncopal episode and weakness and cough.  #1 acute bronchitis-with reactive airway disease. -Start IV steroids, nebulizer treatments, on Levaquin. -Saturations are currently fine. Chest x-ray showing chronic interstitial lung disease findings. -One dose of Lasix being given in the emergency room.  #2 acute renal insufficiency-baseline  creatinine seems to be around 1.2-1.3. Creatinine is 1.79 with hyponatremia. Exam reveals crackles could be from his fibrosis versus interstitial edema. -One dose of Lasix being given. Check renal ultrasound. If creatinine seems to be worsening then gentle hydration need to be given and nephrology consult at that time.  #3 hypertension-continue metoprolol  #4 GERD-on Protonix twice a day  #5 DVT prophylaxis-on Lovenox  #6 presyncope-secondary to weakness from bronchitis. Physical therapy consulted.  Physical therapy consulted  All the records are reviewed and case discussed with ED provider. Management plans discussed with the patient, family and they are in agreement.  CODE STATUS: Full code  TOTAL TIME TAKING CARE OF THIS PATIENT: 50 minutes.    Kilee Hedding M.D on 09/25/2015 at 5:39 PM  Between 7am to 6pm - Pager - [phone]  After 6pm go to [email or web address] - password [email or web address]  Office  [phone]  CC: Primary care physician; Idelle Crouch, MD

## 2015-09-25 NOTE — ED Notes (Signed)
Pt to ed with c/o nausea and weakness x 5 days, denies vomiting, reports diarrhea x several episodes over the last few days.

## 2015-09-25 NOTE — Progress Notes (Signed)
ANTICOAGULATION CONSULT NOTE - Initial Consult  Pharmacy Consult for Lovenox  Indication: VTE prophylaxis  Allergies  Allergen Reactions  . Zithromax [Azithromycin] Anaphylaxis    Patient Measurements: Height: 5\' 5"  (165.1 cm) Weight: 138 lb (62.596 kg) IBW/kg (Calculated) : 61.5 Heparin Dosing Weight:   Vital Signs: Temp: 98.1 F (36.7 C) (01/30 1025) Temp Source: Oral (01/30 1025) BP: 110/79 mmHg (01/30 2100) Pulse Rate: 105 (01/30 2100)  Labs:  Recent Labs  09/25/15 1045 09/25/15 1921  HGB 15.1  --   HCT 45.2  --   PLT 154  --   CREATININE 1.79*  --   TROPONINI 0.03 0.03    Estimated Creatinine Clearance: 23.9 mL/min (by C-G formula based on Cr of 1.79).   Medical History: Past Medical History  Diagnosis Date  . Colon cancer ([place])     in remission  . Hypertension   . Actinic keratoses   . GERD (gastroesophageal reflux disease)   . Irritable bowel syndrome   . Cervicalgia   . Plantar fibromatosis     Medications:  Scheduled:  . aspirin EC  325 mg Oral Daily  . [START ON 09/26/2015] cholestyramine  4 g Oral QAC breakfast  . enoxaparin (LOVENOX) injection  30 mg Subcutaneous Q24H  . fludrocortisone  0.1 mg Oral Daily  . furosemide  40 mg Intravenous Daily  . ipratropium-albuterol  3 mL Nebulization Q4H  . methylPREDNISolone (SOLU-MEDROL) injection  40 mg Intravenous Q12H  . metoprolol succinate  12.5 mg Oral Daily  . multivitamin with minerals  1 tablet Oral Daily  . nortriptyline  25 mg Oral QHS  . pantoprazole  40 mg Oral BID  . sodium chloride flush  3 mL Intravenous Q12H  . timolol  1 drop Both Eyes BID    Assessment: CrCl = 23.9 ml/min  Goal of Therapy:  DVT prophylaxis   Plan:  Lovenox 40 mg SQ Q24H originally ordered.  Will adjust dose to lovenox 30 mg SQ Q24H based on CrCl < 30 ml/min.   Marcianna Daily D 09/25/2015,9:38 PM

## 2015-09-25 NOTE — ED Notes (Signed)
Pt in via triage; pt reports nausea and weakness for "about a week now."  Pt reports diarrhea for the last couple of days.  Pt states "I just don't feel well."  Pt reports that he was using the bathroom last night and passed out; recalls waking up in the floor.  Pt does not recall hitting his head.  Pt A/Ox4 at this time, no immediate distress.  Wife at bedside.

## 2015-09-26 ENCOUNTER — Inpatient Hospital Stay: Payer: Medicare Other

## 2015-09-26 LAB — TROPONIN I
Troponin I: <0.03 ng/mL (ref <0.031)
Troponin I: <0.03 ng/mL (ref <0.031)

## 2015-09-26 LAB — CBC
HCT: 38.7 % — ABNORMAL LOW (ref 40.0–52.0)
Hemoglobin: 13.2 g/dL (ref 13.0–18.0)
MCH: 32.7 pg (ref 26.0–34.0)
MCHC: 34.1 g/dL (ref 32.0–36.0)
MCV: 95.8 fL (ref 80.0–100.0)
PLATELETS: 119 10*3/uL — AB (ref 150–440)
RBC: 4.04 MIL/uL — AB (ref 4.40–5.90)
RDW: 13 % (ref 11.5–14.5)
WBC: 2.6 10*3/uL — AB (ref 3.8–10.6)

## 2015-09-26 LAB — MRSA PCR SCREENING: MRSA BY PCR: NEGATIVE

## 2015-09-26 LAB — BASIC METABOLIC PANEL
Anion gap: 7 (ref 5–15)
BUN: 25 mg/dL — ABNORMAL HIGH (ref 6–20)
CHLORIDE: 97 mmol/L — AB (ref 101–111)
CO2: 22 mmol/L (ref 22–32)
CREATININE: 1.54 mg/dL — AB (ref 0.61–1.24)
Calcium: 8.5 mg/dL — ABNORMAL LOW (ref 8.9–10.3)
GFR calc Af Amer: 44 mL/min — ABNORMAL LOW (ref >60)
GFR, EST NON AFRICAN AMERICAN: 38 mL/min — AB (ref >60)
Glucose, Bld: 159 mg/dL — ABNORMAL HIGH (ref 65–99)
Potassium: 3.9 mmol/L (ref 3.5–5.1)
SODIUM: 126 mmol/L — AB (ref 135–145)

## 2015-09-26 MED ORDER — ENSURE ENLIVE PO LIQD
237.0000 mL | ORAL | Status: DC
  Administered 2015-09-26 – 2015-09-27 (×2): 237 mL via ORAL

## 2015-09-26 MED ORDER — SODIUM CHLORIDE 0.9 % IV SOLN
INTRAVENOUS | Status: AC
  Administered 2015-09-26: 12:00:00 via INTRAVENOUS

## 2015-09-26 MED ORDER — IPRATROPIUM-ALBUTEROL 0.5-2.5 (3) MG/3ML IN SOLN
3.0000 mL | RESPIRATORY_TRACT | Status: DC | PRN
Start: 2015-09-26 — End: 2015-09-27

## 2015-09-26 MED ORDER — LEVOFLOXACIN 500 MG PO TABS
250.0000 mg | ORAL_TABLET | Freq: Every day | ORAL | Status: DC
  Administered 2015-09-26: 17:00:00 250 mg via ORAL
  Filled 2015-09-26: qty 1

## 2015-09-26 NOTE — Progress Notes (Addendum)
Per patient he had pneumonia vaccine in Jan 2009 or 2006 give vaccine

## 2015-09-26 NOTE — Progress Notes (Signed)
Initial Nutrition Assessment   INTERVENTION:   Meals and Snacks: Cater to patient preferences Medical Food Supplement Therapy: will recommend Ensure Enlive po daily, each supplement provides 350 kcal and 20 grams of protein   NUTRITION DIAGNOSIS:   Inadequate oral intake related to acute illness as evidenced by per patient/family report  GOAL:   Patient will meet greater than or equal to 90% of their needs  MONITOR:    (Energy Intake, Electrolyte and Renal Profile, Anthropometrics, Digestive System)  REASON FOR ASSESSMENT:   Malnutrition Screening Tool    ASSESSMENT:   Pt admitted with acute bronchitis and acute renal insufficiency with 3-4 days of poor po intake and nausea.   Past Medical History  Diagnosis Date  . Colon cancer ([place])     in remission  . Hypertension   . Actinic keratoses   . GERD (gastroesophageal reflux disease)   . Irritable bowel syndrome   . Cervicalgia   . Plantar fibromatosis      Diet Order:  Diet 2 gram sodium Room service appropriate?: Yes; Fluid consistency:: Thin    Current Nutrition: Pt reports eating 75% of breakfast this am of grits, eggs, milk, orange juice and coffee.  Food/Nutrition-Related History: Pt reports not eating anything in the past 2 days PTA. Pt reports good appetite previously usually eating cereal and strawberries for breakfast, lunch and dinner. Pt reports having a case of Ensure at home, has been drinking intermittently.   Scheduled Medications:  . aspirin EC  325 mg Oral Daily  . cholestyramine  4 g Oral QAC breakfast  . enoxaparin (LOVENOX) injection  30 mg Subcutaneous Q24H  . feeding supplement (ENSURE ENLIVE)  237 mL Oral Q24H  . fludrocortisone  0.1 mg Oral Daily  . levofloxacin (LEVAQUIN) IV  250 mg Intravenous Q24H  . methylPREDNISolone (SOLU-MEDROL) injection  40 mg Intravenous Q12H  . metoprolol succinate  12.5 mg Oral Daily  . multivitamin with minerals  1 tablet Oral Daily  . nortriptyline  25  mg Oral QHS  . pantoprazole  40 mg Oral BID  . pneumococcal 23 valent vaccine  0.5 mL Intramuscular Tomorrow-1000  . sodium chloride flush  3 mL Intravenous Q12H  . timolol  1 drop Both Eyes BID    Continuous Medications:  . sodium chloride       Electrolyte/Renal Profile and Glucose Profile:   Recent Labs Lab 09/25/15 1045 09/26/15 0728  NA 130* 126*  K 3.9 3.9  CL 98* 97*  CO2 21* 22  BUN 24* 25*  CREATININE 1.79* 1.54*  CALCIUM 9.7 8.5*  GLUCOSE 133* 159*   Protein Profile: No results for input(s): ALBUMIN in the last 168 hours.  Gastrointestinal Profile: Last BM:  09/26/2015   Nutrition-Focused Physical Exam Findings: Nutrition-Focused physical exam completed. Findings are no fat depletion, mild-moderate muscle depletion, and no edema.    Weight Change: Pt reports weight loss of 10lbs 'or so' in the past 6 months, 7% in 6 months   Height:   Ht Readings from Last 1 Encounters:  09/25/15 5\' 5"  (1.651 m)    Weight:   Wt Readings from Last 1 Encounters:  09/25/15 134 lb 14.4 oz (61.19 kg)     BMI:  Body mass index is 22.45 kg/(m^2).  Estimated Nutritional Needs:   Kcal:  BEE: 1192kcals, TEE: (IF 1.1-1.3)(AF 1.2) 1573-1859kcals  Protein:  61-73g protein (1.0-1.2g/kg)  Fluid:  1525-1830mL of fluid (25-59mL/kg)  EDUCATION NEEDS:   No education needs identified at this time  MODERATE Care Level  Dwyane Luo, [place], [place] Pager [phone] Weekend/On-Call Pager [phone]

## 2015-09-26 NOTE — Evaluation (Signed)
Physical Therapy Evaluation Patient Details Name: Kevin Rush MRN: [id number] DOB: 1923-08-18 Today's Date: 09/26/2015   History of Present Illness  Pt is admitted for bronchitis. Pt with complaints of nausea. Pt with history of colon cancer that is in remission, HTN, and GERD. Pt with no PE at this time.  Clinical Impression  Pt is a pleasant 82 year old male who was admitted for bronchitis. Pt demonstrates all bed mobility/transfers/ambulation at baseline level. Pt with safe technique using rw and educated that rw should be used for all mobility as he has improved balance and decreased fall risk. Pt completed 5 time sit<>Stand demonstrating good power, completing in 14 seconds. Pt does not require any further PT needs at this time. Pt will be dc in house and does not require follow up. RN aware. Will dc current orders.      Follow Up Recommendations No PT follow up    Equipment Recommendations       Recommendations for Other Services       Precautions / Restrictions Precautions Precautions: Fall Restrictions Weight Bearing Restrictions: No      Mobility  Bed Mobility Overal bed mobility: Independent             General bed mobility comments: safe technique performed  Transfers Overall transfer level: Independent Equipment used: None             General transfer comment: safe technique performed without AD. Pt also completed 5 time sit<>stand in 14 seconds without use of RW  Ambulation/Gait Ambulation/Gait assistance: Min guard Ambulation Distance (Feet): 200 Feet Assistive device: Rolling walker (2 wheeled) Gait Pattern/deviations: Step-through pattern     General Gait Details: ambulated 100' without RW demonstrating large BOS and unsteadiness present. Pt with slow gait speed. Pt given rw with improved fluid gait pattern along with increased speed and no LOB.   Stairs            Wheelchair Mobility    Modified Rankin (Stroke Patients Only)        Balance Overall balance assessment: History of Falls;Modified Independent                                           Pertinent Vitals/Pain Pain Assessment: No/denies pain    Home Living Family/patient expects to be discharged to:: Assisted living               Home Equipment: Walker - 4 wheels      Prior Function Level of Independence: Independent with assistive device(s)         Comments: uses rollater     Hand Dominance        Extremity/Trunk Assessment   Upper Extremity Assessment: Overall WFL for tasks assessed           Lower Extremity Assessment: Overall WFL for tasks assessed         Communication   Communication: No difficulties  Cognition Arousal/Alertness: Awake/alert Behavior During Therapy: WFL for tasks assessed/performed Overall Cognitive Status: Within Functional Limits for tasks assessed                      General Comments      Exercises        Assessment/Plan    PT Assessment Patent does not need any further PT services  PT Diagnosis     PT  Problem List    PT Treatment Interventions     PT Goals (Current goals can be found in the Care Plan section) Acute Rehab PT Goals Patient Stated Goal: to go home PT Goal Formulation: With patient Time For Goal Achievement: 2015-10-01 Potential to Achieve Goals: Good    Frequency     Barriers to discharge        Co-evaluation               End of Session Equipment Utilized During Treatment: Gait belt Activity Tolerance: Patient tolerated treatment well Patient left: in bed;with bed alarm set Nurse Communication: Mobility status         Time: [id number] PT Time Calculation (min) (ACUTE ONLY): 33 min   Charges:   PT Evaluation $PT Eval Low Complexity: 1 Procedure PT Treatments $Gait Training: 8-22 mins   PT G Codes:        Dhanush Jokerst 2015-10-01, 3:57 PM  Greggory Stallion, PT, DPT [phone]

## 2015-09-26 NOTE — Clinical Documentation Improvement (Addendum)
Hospitalist at [facility]  (Query responses must be documented in the current medical record, not on the CDI BPA form.)  Please document if a condition below provides greater specificity regarding the patient's "acute renal insufficiency":  - Acute Kidney Injury, including any associated condition(s), cause(s) and/or treatment(s).  - Other condition  - Unable to clinically determine  Clinical Information: "#2 acute renal insufficiency-baseline creatinine seems to be around 1.2-1.3. Creatinine is 1.79 with hyponatremia. Exam reveals crackles could be from his fibrosis versus interstitial edema" is documented in the Impression and Plan of the H&P and subsequent progress notes.  BUN/Cr./GFR trend this admission (white male) with a documented baseline of 1.2-1.3 Component     Latest Ref Rng 09/25/2015 09/26/2015  BUN     6 - 20 mg/dL 24 (H) 25 (H)  Creatinine     0.61 - 1.24 mg/dL 1.79 (H) 1.54 (H)  EGFR (Non-African Amer.)     >60 mL/min 32 (L) 38 (L)     Please exercise your independent, professional judgment when responding. A specific answer is not anticipated or expected.   Thank You, Erling Conte  RN BSN CCDS [phone] Health Information Management [place]

## 2015-09-26 NOTE — Progress Notes (Signed)
[place] at [facility]                                                                                                                                                                                            Patient Demographics   Kevin Rush, is a 82 y.o. male, DOB - August 23, 1923, [id number]  Admit date - 09/25/2015   Admitting Physician Gladstone Lighter, MD  Outpatient Primary MD for the patient is SPARKS,JEFFREY D, MD   LOS - 1  Subjective: Patient admitted with generalized weakness near syncope and coughing. He feels little stronger     Review of Systems:   CONSTITUTIONAL: No documented fever. No fatigue, weakness. No weight gain, no weight loss.  EYES: No blurry or double vision.  ENT: No tinnitus. No postnasal drip. No redness of the oropharynx.  RESPIRATORY: Positive cough, no wheeze, no hemoptysis. No dyspnea.  CARDIOVASCULAR: No chest pain. No orthopnea. No palpitations. No syncope.  GASTROINTESTINAL: No nausea, no vomiting or diarrhea. No abdominal pain. No melena or hematochezia.  GENITOURINARY: No dysuria or hematuria.  ENDOCRINE: No polyuria or nocturia. No heat or cold intolerance.  HEMATOLOGY: No anemia. No bruising. No bleeding.  INTEGUMENTARY: No rashes. No lesions.  MUSCULOSKELETAL: No arthritis. No swelling. No gout.  NEUROLOGIC: No numbness, tingling, or ataxia. No seizure-type activity.  PSYCHIATRIC: No anxiety. No insomnia. No ADD.    Vitals:   Filed Vitals:   09/26/15 0403 09/26/15 0522 09/26/15 0900 09/26/15 1002  BP:  110/61 124/71   Pulse:  69 80 78  Temp:  97.5 F (36.4 C) 97.7 F (36.5 C)   TempSrc:  Oral Oral   Resp:  18 17   Height:      Weight:      SpO2: 91% 99%  100%    Wt Readings from Last 3 Encounters:  09/25/15 61.19 kg (134 lb 14.4 oz)     Intake/Output Summary (Last 24 hours) at 09/26/15 1046 Last data filed at 09/26/15 0900  Gross per 24 hour  Intake    480 ml  Output   1075 ml   Net   -595 ml    Physical Exam:   GENERAL: Pleasant-appearing in no apparent distress.  HEAD, EYES, EARS, NOSE AND THROAT: Atraumatic, normocephalic. Extraocular muscles are intact. Pupils equal and reactive to light. Sclerae anicteric. No conjunctival injection. No oro-pharyngeal erythema.  NECK: Supple. There is no jugular venous distention. No bruits, no lymphadenopathy, no thyromegaly.  HEART: Regular rate and rhythm,. No murmurs, no rubs, no clicks.  LUNGS: Rhonchus  Breath sounds  ABDOMEN: Soft, flat, nontender, nondistended. Has  good bowel sounds. No hepatosplenomegaly appreciated.  EXTREMITIES: No evidence of any cyanosis, clubbing, or peripheral edema.  +2 pedal and radial pulses bilaterally.  NEUROLOGIC: The patient is alert, awake, and oriented x3 with no focal motor or sensory deficits appreciated bilaterally.  SKIN: Moist and warm with no rashes appreciated.  Psych: Not anxious, depressed LN: No inguinal LN enlargement    Antibiotics   Anti-infectives    Start     Dose/Rate Route Frequency Ordered Stop   09/26/15 1800  Levofloxacin (LEVAQUIN) IVPB 250 mg     250 mg 50 mL/hr over 60 Minutes Intravenous Every 24 hours 09/25/15 2149     09/25/15 2200  levofloxacin (LEVAQUIN) IVPB 500 mg     500 mg 100 mL/hr over 60 Minutes Intravenous  Once 09/25/15 2149 09/26/15 0032      Medications   Scheduled Meds: . aspirin EC  325 mg Oral Daily  . cholestyramine  4 g Oral QAC breakfast  . enoxaparin (LOVENOX) injection  30 mg Subcutaneous Q24H  . fludrocortisone  0.1 mg Oral Daily  . levofloxacin (LEVAQUIN) IV  250 mg Intravenous Q24H  . methylPREDNISolone (SOLU-MEDROL) injection  40 mg Intravenous Q12H  . metoprolol succinate  12.5 mg Oral Daily  . multivitamin with minerals  1 tablet Oral Daily  . nortriptyline  25 mg Oral QHS  . pantoprazole  40 mg Oral BID  . pneumococcal 23 valent vaccine  0.5 mL Intramuscular Tomorrow-1000  . sodium chloride flush  3 mL Intravenous  Q12H  . timolol  1 drop Both Eyes BID   Continuous Infusions: . sodium chloride     PRN Meds:.acetaminophen **OR** acetaminophen, ipratropium-albuterol, ondansetron **OR** ondansetron (ZOFRAN) IV, polyethylene glycol   Data Review:   Micro Results Recent Results (from the past 240 hour(s))  MRSA PCR Screening     Status: None   Collection Time: 09/26/15  2:50 AM  Result Value Ref Range Status   MRSA by PCR NEGATIVE NEGATIVE Final    Comment:        The GeneXpert MRSA Assay (FDA approved for NASAL specimens only), is one component of a comprehensive MRSA colonization surveillance program. It is not intended to diagnose MRSA infection nor to guide or monitor treatment for MRSA infections.     Radiology Reports Dg Chest 2 View  09/25/2015  CLINICAL DATA:  Tachycardia and shortness of breath, chronic. History of pneumonia, hypertension, colon cancer. Former smoker. EXAM: CHEST  2 VIEW COMPARISON:  None. FINDINGS: Heart size is upper normal. Lungs at least mildly hyperexpanded suggesting underlying COPD. Coarse interstitial markings throughout both lungs are most likely related to chronic interstitial fibrosis, less likely interstitial edema. Suspect associated chronic bronchitic changes centrally. No confluent opacity to suggest a developing pneumonia. No pleural effusion. No pneumothorax. Mild degenerative change noted throughout the slightly kyphotic thoracic spine. No acute osseous abnormality seen. IMPRESSION: 1. Lung findings most suggestive of chronic interstitial lung disease/fibrosis. Without prior exams for comparison, I cannot exclude some degree of superimposed interstitial edema. 2. No evidence of pneumonia. 3. Lungs mildly hyperexpanded suggesting COPD. Suspect associated chronic bronchitic changes centrally. Electronically Signed   By: Franki Cabot M.D.   On: 09/25/2015 15:21   US Renal  09/25/2015  CLINICAL DATA:  Acute renal failure. EXAM: RENAL / URINARY TRACT  ULTRASOUND COMPLETE COMPARISON:  Abdominal ultrasound 02/17/2011. FINDINGS: Right Kidney: Length: 10.0 cm. Echogenicity within normal limits. No mass or hydronephrosis visualized. Left Kidney: Length: 11.1 cm. There is a simple appearing cyst  in the upper pole, measuring 2.3 cm maximally. No suspicious cortical lesion or hydronephrosis. Bladder: Appears normal for degree of bladder distention. IMPRESSION: 1. Both kidneys are normal in size without hydronephrosis. 2. Simple left renal cyst. Electronically Signed   By: Richardean Sale M.D.   On: 09/25/2015 18:24   Nm Pulmonary Perf And Vent  09/25/2015  CLINICAL DATA:  82 year old nursing home patient with personal history of colon cancer admitted after a pre syncopal episode yesterday. Approximate 4 day history of progressively worsening generalized weakness. Acute renal insufficiency precluded IV contrast. EXAM: NUCLEAR MEDICINE VENTILATION - PERFUSION LUNG SCAN TECHNIQUE: Ventilation images were obtained in multiple projections using inhaled aerosol Tc-35m DTPA. Perfusion images were obtained in multiple projections after intravenous injection of Tc-45m MAA. RADIOPHARMACEUTICALS:  31.7 Technetium-60m DTPA aerosol inhalation and 4.1 Technetium-75m MAA IV COMPARISON:  No prior nuclear imaging. Two-view chest x-ray earlier today is correlated. FINDINGS: Ventilation: Heterogeneous ventilation in both lungs, corresponding to the fibrosis identified on the chest x-ray. Perfusion: Homogeneous perfusion in both lungs with no wedge shaped peripheral perfusion defects to suggest acute pulmonary embolism. IMPRESSION: 1. Normal pulmonary perfusion without evidence of pulmonary embolism. 2. Heterogeneous ventilation in both lungs corresponding to the the interstitial fibrosis identified on chest x-ray. Electronically Signed   By: Evangeline Dakin M.D.   On: 09/25/2015 19:39     CBC  Recent Labs Lab 09/25/15 1045 09/26/15 0728  WBC 5.3 2.6*  HGB 15.1 13.2  HCT 45.2  38.7*  PLT 154 119*  MCV 96.0 95.8  MCH 32.2 32.7  MCHC 33.5 34.1  RDW 13.5 13.0    Chemistries   Recent Labs Lab 09/25/15 1045 09/26/15 0728  NA 130* 126*  K 3.9 3.9  CL 98* 97*  CO2 21* 22  GLUCOSE 133* 159*  BUN 24* 25*  CREATININE 1.79* 1.54*  CALCIUM 9.7 8.5*   ------------------------------------------------------------------------------------------------------------------ estimated creatinine clearance is 27.6 mL/min (by C-G formula based on Cr of 1.54). ------------------------------------------------------------------------------------------------------------------ No results for input(s): HGBA1C in the last 72 hours. ------------------------------------------------------------------------------------------------------------------ No results for input(s): CHOL, HDL, LDLCALC, TRIG, CHOLHDL, LDLDIRECT in the last 72 hours. ------------------------------------------------------------------------------------------------------------------ No results for input(s): TSH, T4TOTAL, T3FREE, THYROIDAB in the last 72 hours.  Invalid input(s): FREET3 ------------------------------------------------------------------------------------------------------------------ No results for input(s): VITAMINB12, FOLATE, FERRITIN, TIBC, IRON, RETICCTPCT in the last 72 hours.  Coagulation profile No results for input(s): INR, PROTIME in the last 168 hours.  No results for input(s): DDIMER in the last 72 hours.  Cardiac Enzymes  Recent Labs Lab 09/25/15 1921 09/26/15 0101 09/26/15 0728  TROPONINI 0.03 <0.03 <0.03   ------------------------------------------------------------------------------------------------------------------ Invalid input(s): POCBNP    Assessment & Plan   Hawkeye Pine is a 82 y.o. male with a known history of colon cancer in remission, hypertension, GERD who lives at assisted living facility came in after presyncopal episode and weakness and cough.  #1 acute  bronchitis-with reactive airway disease. Continue with Levaquin and Solu-Medrol, patient's VQ scan consistent with pulmonary fibrosis I will obtain a CT scan of the chest to further evaluate  #2 acute renal insufficiency-baseline creatinine seems to be around 1.2-1.3.  His creatinine did improve her sodium is dropped we'll try some IV fluids and repeat BMP in the morning  #3 hyponatremia possibly related to Lasix I will go ahead and give him normal saline  #4hypertension-continue metoprolol  #5 GERD-on Protonix twice a day  #6 DVT prophylaxis-on Lovenox      Code Status Orders        Start  Ordered   09/25/15 2136  Full code   Continuous     09/25/15 2135    Code Status History    Date Active Date Inactive Code Status Order ID Comments User Context   This patient has a current code status but no historical code status.           Consults  None   DVT Prophylaxis lovenox  Lab Results  Component Value Date   PLT 119* 09/26/2015     Time Spent in minutes  30min   Dustin Flock M.D on 09/26/2015 at 10:46 AM  Between 7am to 6pm - Pager - [phone]  After 6pm go to [email or web address] - password [email or web address] [facility]   Office  [phone]

## 2015-09-26 NOTE — Care Management (Signed)
Admitted to [facility] with the diagnosis of bronchitis. Lives with wife, Shanon Bonomi, x 45 years. Last seen Dr. Doy Hutching January 1st. Lives at the [facility] [place] x 9 years. Never been to the skilled facility. Rolling walker in the home. Takes care of all basic and instrumental activities of daily living himself.  Physical therapy evaluation pending.  Shelbie Ammons RN MSN CCM Care Management [phone]

## 2015-09-27 DIAGNOSIS — J209 Acute bronchitis, unspecified: Secondary | ICD-10-CM | POA: Diagnosis not present

## 2015-09-27 LAB — BASIC METABOLIC PANEL
ANION GAP: 4 — AB (ref 5–15)
BUN: 28 mg/dL — ABNORMAL HIGH (ref 6–20)
CHLORIDE: 107 mmol/L (ref 101–111)
CO2: 23 mmol/L (ref 22–32)
Calcium: 9 mg/dL (ref 8.9–10.3)
Creatinine, Ser: 1.34 mg/dL — ABNORMAL HIGH (ref 0.61–1.24)
GFR calc non Af Amer: 45 mL/min — ABNORMAL LOW (ref >60)
GFR, EST AFRICAN AMERICAN: 52 mL/min — AB (ref >60)
Glucose, Bld: 143 mg/dL — ABNORMAL HIGH (ref 65–99)
POTASSIUM: 3.7 mmol/L (ref 3.5–5.1)
Sodium: 134 mmol/L — ABNORMAL LOW (ref 135–145)

## 2015-09-27 LAB — CBC
HCT: 37.2 % — ABNORMAL LOW (ref 40.0–52.0)
HEMOGLOBIN: 12.6 g/dL — AB (ref 13.0–18.0)
MCH: 31.8 pg (ref 26.0–34.0)
MCHC: 33.8 g/dL (ref 32.0–36.0)
MCV: 94 fL (ref 80.0–100.0)
Platelets: 134 10*3/uL — ABNORMAL LOW (ref 150–440)
RBC: 3.96 MIL/uL — AB (ref 4.40–5.90)
RDW: 13 % (ref 11.5–14.5)
WBC: 3.7 10*3/uL — ABNORMAL LOW (ref 3.8–10.6)

## 2015-09-27 MED ORDER — IPRATROPIUM-ALBUTEROL 20-100 MCG/ACT IN AERS: 1.0000 | INHALATION_SPRAY | Freq: Four times a day (QID) | RESPIRATORY_TRACT | Status: DC

## 2015-09-27 MED ORDER — GUAIFENESIN 100 MG/5ML PO SOLN: 5.0000 mL | ORAL | Status: DC | PRN

## 2015-09-27 MED ORDER — PREDNISONE 10 MG (21) PO TBPK: ORAL_TABLET | ORAL | Status: DC

## 2015-09-27 MED ORDER — ENOXAPARIN SODIUM 40 MG/0.4ML ~~LOC~~ SOLN: 40.0000 mg | SUBCUTANEOUS | Status: DC

## 2015-09-27 MED ORDER — ENSURE ENLIVE PO LIQD: 237.0000 mL | ORAL | Status: AC

## 2015-09-27 MED ORDER — LEVOFLOXACIN 250 MG PO TABS: 250.0000 mg | ORAL_TABLET | Freq: Every day | ORAL | Status: AC

## 2015-09-27 NOTE — Care Management Important Message (Signed)
Important Message  Patient Details  Name: Kevin Rush MRN: [id number] Date of Birth: [birth year]   Medicare Important Message Given:  Yes    Juliann Pulse A Unknown Schleyer 09/27/2015, 1:29 PM

## 2015-09-27 NOTE — Progress Notes (Signed)
Patient had no c/o pain this shift  VSS Received MD order to discharge patient to home reviewed home medications discharge instructions and prescriptions with patient son and wife and all verbalized understanding discharged to home in wheelchair with auxillary

## 2015-09-27 NOTE — Discharge Summary (Signed)
Kevin Rush, 82 y.o., DOB 06-27-1923, MRN [id number]. Admission date: 09/25/2015 Discharge Date 09/27/2015 Primary MD SPARKS,JEFFREY D, MD Admitting Physician Gladstone Lighter, MD  Admission Diagnosis  Shortness of breath [R06.02] ARF (acute renal failure) ([place]) [N17.9] Tachycardia [R00.0]  Discharge Diagnosis   Active Problems:   Near syncope suspect due to vasovagal Acute bronchitis Pulmonary fibrosis based on the CT scan Likely dementia Possible gait disorder Acute renal failure  History of colon cancer Potential History of actinic keratosis GERD Irritable bowel syndrome         Hospital CourseJ ames Rush is a 82 y.o. male with a known history of colon cancer in remission, hypertension, GERD who lives at assisted living facility came in after presyncopal episode and weakness and cough. Patient was seen in the ED chest x-ray suggestive of significant bronchitic changes. He was admitted to the hospital for acute bronchitis and was treated with nebulizers and antibiotics and steroids. He was also given Lasix but his creatinine actually worsened therefore Lasix was discontinued. He had a CT scan of the chest which show pulmonary fibrosis. He will need outpatient pulmonary follow-up for possible PFTs to further evaluate his lung functioning.'s son was also concern about patient's memory and gait issues. I recommended outpatient neurology follow-up.             Consults  None  Significant Tests:  See full reports for all details    Dg Chest 2 View  09/25/2015  CLINICAL DATA:  Tachycardia and shortness of breath, chronic. History of pneumonia, hypertension, colon cancer. Former smoker. EXAM: CHEST  2 VIEW COMPARISON:  None. FINDINGS: Heart size is upper normal. Lungs at least mildly hyperexpanded suggesting underlying COPD. Coarse interstitial markings throughout both lungs are most likely related to chronic interstitial fibrosis, less likely interstitial edema. Suspect  associated chronic bronchitic changes centrally. No confluent opacity to suggest a developing pneumonia. No pleural effusion. No pneumothorax. Mild degenerative change noted throughout the slightly kyphotic thoracic spine. No acute osseous abnormality seen. IMPRESSION: 1. Lung findings most suggestive of chronic interstitial lung disease/fibrosis. Without prior exams for comparison, I cannot exclude some degree of superimposed interstitial edema. 2. No evidence of pneumonia. 3. Lungs mildly hyperexpanded suggesting COPD. Suspect associated chronic bronchitic changes centrally. Electronically Signed   By: Franki Cabot M.D.   On: 09/25/2015 15:21   Ct Chest Wo Contrast  09/26/2015  CLINICAL DATA:  Cough several weeks. Recent increased weakness and nausea. EXAM: CT CHEST WITHOUT CONTRAST TECHNIQUE: Multidetector CT imaging of the chest was performed following the standard protocol without IV contrast. COMPARISON:  Chest x-ray 09/25/2015 FINDINGS: Lungs are adequately inflated with mild bilateral peripheral increased interstitial markings right worse than left suggesting mild fibrosis. Mild associated bronchiectatic changes. No significant focal airspace process. No evidence of effusion. Heart is normal size. There is calcified plaque over the coronary arteries with possible stent over the left anterior descending coronary artery. There is calcified plaque over the thoracic aorta. 1 cm right peritracheal lymph node likely reactive. No evidence of hilar or axillary adenopathy. Remaining mediastinal structures are within normal. The Images through the upper abdomen demonstrate evidence of previous cholecystectomy. Surgical suture line is present over the transverse colon. Diverticulosis of the colon. 2.5 cm cyst over the mid pole left kidney. There are degenerative changes of the spine. IMPRESSION: Peripheral interstitial changes right worse than left suggesting mild pulmonary fibrosis with associated bronchiectasis.  No focal airspace process or effusion. Atherosclerotic coronary artery disease. 2.5 cm left renal cyst.  Diverticulosis of the colon. Electronically Signed   By: Marin Olp M.D.   On: 09/26/2015 15:06   US Renal  09/25/2015  CLINICAL DATA:  Acute renal failure. EXAM: RENAL / URINARY TRACT ULTRASOUND COMPLETE COMPARISON:  Abdominal ultrasound 02/17/2011. FINDINGS: Right Kidney: Length: 10.0 cm. Echogenicity within normal limits. No mass or hydronephrosis visualized. Left Kidney: Length: 11.1 cm. There is a simple appearing cyst in the upper pole, measuring 2.3 cm maximally. No suspicious cortical lesion or hydronephrosis. Bladder: Appears normal for degree of bladder distention. IMPRESSION: 1. Both kidneys are normal in size without hydronephrosis. 2. Simple left renal cyst. Electronically Signed   By: Richardean Sale M.D.   On: 09/25/2015 18:24   Nm Pulmonary Perf And Vent  09/25/2015  CLINICAL DATA:  82 year old nursing home patient with personal history of colon cancer admitted after a pre syncopal episode yesterday. Approximate 4 day history of progressively worsening generalized weakness. Acute renal insufficiency precluded IV contrast. EXAM: NUCLEAR MEDICINE VENTILATION - PERFUSION LUNG SCAN TECHNIQUE: Ventilation images were obtained in multiple projections using inhaled aerosol Tc-57m DTPA. Perfusion images were obtained in multiple projections after intravenous injection of Tc-80m MAA. RADIOPHARMACEUTICALS:  31.7 Technetium-78m DTPA aerosol inhalation and 4.1 Technetium-44m MAA IV COMPARISON:  No prior nuclear imaging. Two-view chest x-ray earlier today is correlated. FINDINGS: Ventilation: Heterogeneous ventilation in both lungs, corresponding to the fibrosis identified on the chest x-ray. Perfusion: Homogeneous perfusion in both lungs with no wedge shaped peripheral perfusion defects to suggest acute pulmonary embolism. IMPRESSION: 1. Normal pulmonary perfusion without evidence of pulmonary  embolism. 2. Heterogeneous ventilation in both lungs corresponding to the the interstitial fibrosis identified on chest x-ray. Electronically Signed   By: Evangeline Dakin M.D.   On: 09/25/2015 19:39       Today   Subjective:   Kevin Rush feels well denies any complaints  Objective:   Blood pressure 147/75, pulse 76, temperature 98.2 F (36.8 C), temperature source Oral, resp. rate 18, height 5\' 5"  (1.651 m), weight 61.19 kg (134 lb 14.4 oz), SpO2 96 %.  .  Intake/Output Summary (Last 24 hours) at 09/27/15 1349 Last data filed at 09/27/15 0900  Gross per 24 hour  Intake    360 ml  Output   2000 ml  Net  -1640 ml    Exam VITAL SIGNS: Blood pressure 147/75, pulse 76, temperature 98.2 F (36.8 C), temperature source Oral, resp. rate 18, height 5\' 5"  (1.651 m), weight 61.19 kg (134 lb 14.4 oz), SpO2 96 %.  GENERAL:  82 y.o.-year-old patient lying in the bed with no acute distress.  EYES: Pupils equal, round, reactive to light and accommodation. No scleral icterus. Extraocular muscles intact.  HEENT: Head atraumatic, normocephalic. Oropharynx and nasopharynx clear.  NECK:  Supple, no jugular venous distention. No thyroid enlargement, no tenderness.  LUNGS: Crackles at the bases CARDIOVASCULAR: S1, S2 normal. No murmurs, rubs, or gallops.  ABDOMEN: Soft, nontender, nondistended. Bowel sounds present. No organomegaly or mass.  EXTREMITIES: No pedal edema, cyanosis, or clubbing.  NEUROLOGIC: Cranial nerves II through XII are intact. Muscle strength 5/5 in all extremities. Sensation intact. Gait not checked.  PSYCHIATRIC: The patient is alert and oriented to person and place SKIN: No obvious rash, lesion, or ulcer.   Data Review     CBC w Diff: Lab Results  Component Value Date   WBC 3.7* 09/27/2015   WBC 5.7 12/19/2014   HGB 12.6* 09/27/2015   HGB 14.4 12/19/2014   HCT 37.2* 09/27/2015  HCT 41.8 12/19/2014   PLT 134* 09/27/2015   PLT 159 12/19/2014   LYMPHOPCT 25  03/15/2015   LYMPHOPCT 28.9 01/04/2014   MONOPCT 7 03/15/2015   MONOPCT 9.5 01/04/2014   EOSPCT 4 03/15/2015   EOSPCT 5.2 01/04/2014   BASOPCT 1 03/15/2015   BASOPCT 0.5 01/04/2014   CMP: Lab Results  Component Value Date   NA 134* 09/27/2015   NA 137 12/19/2014   K 3.7 09/27/2015   K 4.2 12/19/2014   CL 107 09/27/2015   CL 103 12/19/2014   CO2 23 09/27/2015   CO2 25 12/19/2014   BUN 28* 09/27/2015   BUN 17 12/19/2014   CREATININE 1.34* 09/27/2015   CREATININE 1.24 12/19/2014   PROT 7.5 12/19/2014   ALBUMIN 4.1 12/19/2014   BILITOT 0.8 12/19/2014   ALKPHOS 72 12/19/2014   AST 24 12/19/2014   ALT 17 12/19/2014  .  Micro Results Recent Results (from the past 240 hour(s))  MRSA PCR Screening     Status: None   Collection Time: 09/26/15  2:50 AM  Result Value Ref Range Status   MRSA by PCR NEGATIVE NEGATIVE Final    Comment:        The GeneXpert MRSA Assay (FDA approved for NASAL specimens only), is one component of a comprehensive MRSA colonization surveillance program. It is not intended to diagnose MRSA infection nor to guide or monitor treatment for MRSA infections.         Code Status Orders        Start     Ordered   09/25/15 2136  Full code   Continuous     09/25/15 2135    Code Status History    Date Active Date Inactive Code Status Order ID Comments User Context   This patient has a current code status but no historical code status.          Follow-up Information    Follow up with SPARKS,JEFFREY D, MD In 7 days.   Specialty:  Internal Medicine   Contact information:   [facility] [address] [phone]       Follow up with Vickki Hearing, MD On 10/04/2015.   Specialty:  Neurology   Why:  2:00 p.m. for dementia, gait abnormality   Contact information:   [facility] [place] [address] [phone]       Follow up with Wallene Huh, MD  In 2 weeks.   Specialty:  Specialist   Why:  pulmonary fibroosis, pf't   Contact information:   [place] [id number] [phone]       Discharge Medications     Medication List    TAKE these medications        aspirin EC 325 MG tablet  Take 325 mg by mouth daily.     cholestyramine 4 g packet  Commonly known as:  QUESTRAN  Take 4 g by mouth daily before breakfast.     cyanocobalamin 1000 MCG/ML injection  Commonly known as:  (VITAMIN B-12)  Inject 1,000 mcg into the muscle every 30 (thirty) days. Pt gets on the first of every month.     feeding supplement (ENSURE ENLIVE) Liqd  Take 237 mLs by mouth daily.     fludrocortisone 0.1 MG tablet  Commonly known as:  FLORINEF  Take 0.1 mg by mouth daily.     guaiFENesin 100 MG/5ML Soln  Commonly known as:  ROBITUSSIN  Take 5 mLs (100 mg total) by mouth every 4 (four) hours as needed for cough or to loosen phlegm.     Ipratropium-Albuterol 20-100 MCG/ACT Aers respimat  Commonly known as:  COMBIVENT RESPIMAT  Inhale 1 puff into the lungs every 6 (six) hours.     levofloxacin 250 MG tablet  Commonly known as:  LEVAQUIN  Take 1 tablet (250 mg total) by mouth daily.     metoprolol succinate 25 MG 24 hr tablet  Commonly known as:  TOPROL-XL  Take 12.5 mg by mouth daily.     multivitamin with minerals Tabs tablet  Take 1 tablet by mouth daily.     nortriptyline 25 MG capsule  Commonly known as:  PAMELOR  Take 25 mg by mouth at bedtime.     omeprazole 20 MG capsule  Commonly known as:  PRILOSEC  Take 20 mg by mouth 2 (two) times daily.     predniSONE 10 MG (21) Tbpk tablet  Commonly known as:  STERAPRED UNI-PAK 21 TAB  Start 50mg  taper by 10mg  until complete     timolol 0.5 % ophthalmic solution  Commonly known as:  TIMOPTIC  Place 1 drop into both eyes 2 (two) times daily.           Total Time in preparing paper work, data evaluation and todays  exam - 35 minutes  Dustin Flock M.D on 09/27/2015 at 1:49 PM  [facility]   Office  [phone]

## 2015-09-27 NOTE — Discharge Instructions (Signed)

## 2015-09-27 NOTE — Progress Notes (Signed)
Enoxaparin   Patient qualifies for Enoxaparin 40 mg SQ daily based on CrCl 0000000 ml/min per policy. Will change to Enoxaparin 40 mg SQ daily.  Larene Beach, PharmD, BCPS  Clinical Pharmacist

## 2016-01-11 DIAGNOSIS — J8410 Pulmonary fibrosis, unspecified: Secondary | ICD-10-CM | POA: Insufficient documentation

## 2016-05-22 ENCOUNTER — Other Ambulatory Visit: Payer: Self-pay | Admitting: Physical Medicine and Rehabilitation

## 2016-05-22 DIAGNOSIS — M5416 Radiculopathy, lumbar region: Secondary | ICD-10-CM

## 2016-06-04 ENCOUNTER — Ambulatory Visit
Admission: RE | Admit: 2016-06-04 | Discharge: 2016-06-04 | Disposition: A | Discharge status: Home / Self Care | Payer: Medicare Other | Source: Ambulatory Visit | Attending: Physical Medicine and Rehabilitation | Admitting: Physical Medicine and Rehabilitation

## 2016-06-04 DIAGNOSIS — M5136 Other intervertebral disc degeneration, lumbar region: Secondary | ICD-10-CM | POA: Insufficient documentation

## 2016-06-04 DIAGNOSIS — M5416 Radiculopathy, lumbar region: Secondary | ICD-10-CM

## 2016-06-04 DIAGNOSIS — M8448XA Pathological fracture, other site, initial encounter for fracture: Secondary | ICD-10-CM | POA: Diagnosis not present

## 2016-06-04 DIAGNOSIS — M4316 Spondylolisthesis, lumbar region: Secondary | ICD-10-CM | POA: Insufficient documentation

## 2016-06-04 DIAGNOSIS — M48061 Spinal stenosis, lumbar region without neurogenic claudication: Secondary | ICD-10-CM | POA: Diagnosis not present

## 2016-08-28 ENCOUNTER — Encounter
Admission: RE | Admit: 2016-08-28 | Discharge: 2016-08-28 | Disposition: A | Discharge status: Home / Self Care | Payer: Medicare Other | Source: Ambulatory Visit | Attending: Orthopedic Surgery | Admitting: Orthopedic Surgery

## 2016-08-28 DIAGNOSIS — J40 Bronchitis, not specified as acute or chronic: Secondary | ICD-10-CM | POA: Insufficient documentation

## 2016-08-28 DIAGNOSIS — M722 Plantar fascial fibromatosis: Secondary | ICD-10-CM | POA: Insufficient documentation

## 2016-08-28 DIAGNOSIS — Z85038 Personal history of other malignant neoplasm of large intestine: Secondary | ICD-10-CM | POA: Insufficient documentation

## 2016-08-28 DIAGNOSIS — M542 Cervicalgia: Secondary | ICD-10-CM | POA: Insufficient documentation

## 2016-08-28 DIAGNOSIS — Z01812 Encounter for preprocedural laboratory examination: Secondary | ICD-10-CM | POA: Insufficient documentation

## 2016-08-28 DIAGNOSIS — K589 Irritable bowel syndrome without diarrhea: Secondary | ICD-10-CM | POA: Insufficient documentation

## 2016-08-28 DIAGNOSIS — I10 Essential (primary) hypertension: Secondary | ICD-10-CM | POA: Insufficient documentation

## 2016-08-28 DIAGNOSIS — Z01810 Encounter for preprocedural cardiovascular examination: Secondary | ICD-10-CM | POA: Insufficient documentation

## 2016-08-28 LAB — SURGICAL PCR SCREEN
MRSA, PCR: NEGATIVE
STAPHYLOCOCCUS AUREUS: POSITIVE — AB

## 2016-08-28 LAB — URINALYSIS, ROUTINE W REFLEX MICROSCOPIC
Bilirubin Urine: NEGATIVE
GLUCOSE, UA: NEGATIVE mg/dL
Hgb urine dipstick: NEGATIVE
Ketones, ur: NEGATIVE mg/dL
LEUKOCYTES UA: NEGATIVE
Nitrite: NEGATIVE
PH: 5 (ref 5.0–8.0)
Protein, ur: NEGATIVE mg/dL
SPECIFIC GRAVITY, URINE: 1.012 (ref 1.005–1.030)

## 2016-08-28 LAB — COMPREHENSIVE METABOLIC PANEL
ALT: 18 U/L (ref 17–63)
AST: 23 U/L (ref 15–41)
Albumin: 3.9 g/dL (ref 3.5–5.0)
Alkaline Phosphatase: 89 U/L (ref 38–126)
Anion gap: 5 (ref 5–15)
BUN: 13 mg/dL (ref 6–20)
CALCIUM: 9.9 mg/dL (ref 8.9–10.3)
CHLORIDE: 100 mmol/L — AB (ref 101–111)
CO2: 27 mmol/L (ref 22–32)
CREATININE: 1.28 mg/dL — AB (ref 0.61–1.24)
GFR, EST AFRICAN AMERICAN: 55 mL/min — AB (ref >60)
GFR, EST NON AFRICAN AMERICAN: 47 mL/min — AB (ref >60)
Glucose, Bld: 102 mg/dL — ABNORMAL HIGH (ref 65–99)
Potassium: 3.9 mmol/L (ref 3.5–5.1)
Sodium: 132 mmol/L — ABNORMAL LOW (ref 135–145)
Total Bilirubin: 0.8 mg/dL (ref 0.3–1.2)
Total Protein: 7.1 g/dL (ref 6.5–8.1)

## 2016-08-28 LAB — TYPE AND SCREEN
ABO/RH(D): O POS
Antibody Screen: NEGATIVE

## 2016-08-28 LAB — CBC
HCT: 38.3 % — ABNORMAL LOW (ref 40.0–52.0)
Hemoglobin: 13.2 g/dL (ref 13.0–18.0)
MCH: 33.7 pg (ref 26.0–34.0)
MCHC: 34.4 g/dL (ref 32.0–36.0)
MCV: 98 fL (ref 80.0–100.0)
PLATELETS: 187 10*3/uL (ref 150–440)
RBC: 3.91 MIL/uL — AB (ref 4.40–5.90)
RDW: 13.3 % (ref 11.5–14.5)
WBC: 3.9 10*3/uL (ref 3.8–10.6)

## 2016-08-28 LAB — C-REACTIVE PROTEIN: CRP: 0.9 mg/dL (ref <1.0)

## 2016-08-28 LAB — PROTIME-INR
INR: 1.04
PROTHROMBIN TIME: 13.6 s (ref 11.4–15.2)

## 2016-08-28 LAB — APTT: aPTT: 35 seconds (ref 24–36)

## 2016-08-28 LAB — SEDIMENTATION RATE: Sed Rate: 30 mm/hr — ABNORMAL HIGH (ref 0–20)

## 2016-08-28 NOTE — Patient Instructions (Signed)
Your procedure is scheduled on: Monday 09/10/15 Report to [facility]. 2ND FLOOR MEDICAL MALL ENTRANCE. To find out your arrival time please call [phone] between 1PM - 3PM on Friday 09/07/15.  Remember: Instructions that are not followed completely may result in serious medical risk, up to and including death, or upon the discretion of your surgeon and anesthesiologist your surgery may need to be rescheduled.    __X__ 1. Do not eat food or drink liquids after midnight. No gum chewing or hard candies.     __X__ 2. No Alcohol for 24 hours before or after surgery.   ____ 3. Bring all medications with you on the day of surgery if instructed.    __X__ 4. Notify your doctor if there is any change in your medical condition     (cold, fever, infections).             ___X__5. No smoking within 24 hours of your surgery.     Do not wear jewelry, make-up, hairpins, clips or nail polish.  Do not wear lotions, powders, or perfumes.   Do not shave 48 hours prior to surgery. Men may shave face and neck.  Do not bring valuables to the hospital.    [facility] is not responsible for any belongings or valuables.               Contacts, dentures or bridgework may not be worn into surgery.  Leave your suitcase in the car. After surgery it may be brought to your room.  For patients admitted to the hospital, discharge time is determined by your                treatment team.   Patients discharged the day of surgery will not be allowed to drive home.   Please read over the following fact sheets that you were given:   Pain Booklet and MRSA Information   __X__ Take these medicines the morning of surgery with A SIP OF WATER:    1. METOPROLOL  2. OMEPRAZOLE  3.   4.  5.  6.  ____ Fleet Enema (as directed)   __X__ Use CHG Soap as directed  ____ Use inhalers on the day of surgery  ____ Stop metformin 2 days prior to surgery    ____ Take 1/2 of usual insulin dose the night before surgery and  none on the morning of surgery.   __X__ Stop Coumadin/Plavix/aspirin on TODAY  __X__ Stop Anti-inflammatories such as Advil, Aleve, Ibuprofen, Motrin, Naproxen, Naprosyn, Goodies,powder, or aspirin products.  OK to take Tylenol.   __X__ Stop supplements until after surgery.    ____ Bring C-Pap to the hospital.

## 2016-08-29 NOTE — Pre-Procedure Instructions (Signed)
Met B, CBC, Sed rate, sent to Dr. Hooten and Anesthesia for review.  Positive staph aureus also sent to Dr. Hooten, asked if wanted any treatment? 

## 2016-08-30 LAB — URINE CULTURE: SPECIAL REQUESTS: NORMAL

## 2016-08-30 NOTE — Pre-Procedure Instructions (Signed)
URINE CULTURE CALLED TO [place] AT DR Clydell Hakim

## 2016-08-30 NOTE — Pre-Procedure Instructions (Signed)
Dr. Marry Guan office called and requested urine culture be repeated, attempted to call patient on Friday (08/30/16), no answer. Generic message on voicemail, will attempt to call patient  later.

## 2016-08-30 NOTE — Pre-Procedure Instructions (Signed)
Urine culture and urinalysis results faxed to Dr. Marry Guan office per [facility] request.

## 2016-09-03 ENCOUNTER — Encounter
Admission: RE | Admit: 2016-09-03 | Discharge: 2016-09-03 | Disposition: A | Discharge status: Home / Self Care | Payer: Medicare Other | Source: Ambulatory Visit | Attending: Orthopedic Surgery | Admitting: Orthopedic Surgery

## 2016-09-03 DIAGNOSIS — C189 Malignant neoplasm of colon, unspecified: Secondary | ICD-10-CM | POA: Insufficient documentation

## 2016-09-03 DIAGNOSIS — K589 Irritable bowel syndrome without diarrhea: Secondary | ICD-10-CM | POA: Diagnosis not present

## 2016-09-03 DIAGNOSIS — Z01812 Encounter for preprocedural laboratory examination: Secondary | ICD-10-CM | POA: Insufficient documentation

## 2016-09-03 DIAGNOSIS — K219 Gastro-esophageal reflux disease without esophagitis: Secondary | ICD-10-CM | POA: Diagnosis not present

## 2016-09-03 DIAGNOSIS — I10 Essential (primary) hypertension: Secondary | ICD-10-CM | POA: Diagnosis not present

## 2016-09-03 NOTE — Pre-Procedure Instructions (Signed)
Patient will return today for recollection of urine specimen for repeat culture

## 2016-09-04 LAB — URINE CULTURE
CULTURE: NO GROWTH
SPECIAL REQUESTS: NORMAL

## 2016-09-04 NOTE — Pre-Procedure Instructions (Signed)
EKG COMPARED WITH PREVIOUS 2015

## 2016-09-09 ENCOUNTER — Inpatient Hospital Stay: Payer: Medicare Other

## 2016-09-09 ENCOUNTER — Inpatient Hospital Stay: Payer: Medicare Other | Admitting: Certified Registered Nurse Anesthetist

## 2016-09-09 ENCOUNTER — Encounter
Admission: RE | Disposition: A | Discharge status: Skilled Nursing Facility | Payer: Self-pay | Source: Ambulatory Visit | Attending: Orthopedic Surgery

## 2016-09-09 ENCOUNTER — Encounter: Payer: Self-pay | Admitting: Orthopedic Surgery

## 2016-09-09 ENCOUNTER — Inpatient Hospital Stay
Admission: RE | Admit: 2016-09-09 | Discharge: 2016-09-12 | DRG: 470 | Disposition: A | Discharge status: Skilled Nursing Facility | Payer: Medicare Other | Source: Ambulatory Visit | Attending: Orthopedic Surgery | Admitting: Orthopedic Surgery

## 2016-09-09 DIAGNOSIS — M6281 Muscle weakness (generalized): Secondary | ICD-10-CM

## 2016-09-09 DIAGNOSIS — M1711 Unilateral primary osteoarthritis, right knee: Principal | ICD-10-CM | POA: Diagnosis present

## 2016-09-09 DIAGNOSIS — Z96659 Presence of unspecified artificial knee joint: Secondary | ICD-10-CM

## 2016-09-09 DIAGNOSIS — Z85038 Personal history of other malignant neoplasm of large intestine: Secondary | ICD-10-CM | POA: Diagnosis not present

## 2016-09-09 DIAGNOSIS — M722 Plantar fascial fibromatosis: Secondary | ICD-10-CM | POA: Insufficient documentation

## 2016-09-09 DIAGNOSIS — K219 Gastro-esophageal reflux disease without esophagitis: Secondary | ICD-10-CM | POA: Diagnosis present

## 2016-09-09 DIAGNOSIS — I10 Essential (primary) hypertension: Secondary | ICD-10-CM | POA: Diagnosis present

## 2016-09-09 DIAGNOSIS — L570 Actinic keratosis: Secondary | ICD-10-CM | POA: Insufficient documentation

## 2016-09-09 DIAGNOSIS — K589 Irritable bowel syndrome without diarrhea: Secondary | ICD-10-CM | POA: Diagnosis present

## 2016-09-09 DIAGNOSIS — R262 Difficulty in walking, not elsewhere classified: Secondary | ICD-10-CM

## 2016-09-09 HISTORY — PX: KNEE ARTHROPLASTY: SHX992

## 2016-09-09 SURGERY — ARTHROPLASTY, KNEE, TOTAL, USING IMAGELESS COMPUTER-ASSISTED NAVIGATION
Anesthesia: General | Site: Knee | Laterality: Right | Wound class: Clean

## 2016-09-09 MED ORDER — GLYCOPYRROLATE 0.2 MG/ML IJ SOLN
INTRAMUSCULAR | Status: DC | PRN
  Administered 2016-09-09: 0.1 mg via INTRAVENOUS

## 2016-09-09 MED ORDER — DEXAMETHASONE SODIUM PHOSPHATE 10 MG/ML IJ SOLN
INTRAMUSCULAR | Status: DC | PRN
  Administered 2016-09-09: 5 mg via INTRAVENOUS

## 2016-09-09 MED ORDER — ACETAMINOPHEN 650 MG RE SUPP: 650.0000 mg | Freq: Four times a day (QID) | RECTAL | Status: DC | PRN

## 2016-09-09 MED ORDER — TRANEXAMIC ACID 1000 MG/10ML IV SOLN
1000.0000 mg | INTRAVENOUS | Status: DC
  Filled 2016-09-09 (×2): qty 10

## 2016-09-09 MED ORDER — OXYMETAZOLINE HCL 0.05 % NA SOLN
1.0000 | Freq: Every day | NASAL | Status: DC
  Administered 2016-09-11: 1 via NASAL
  Filled 2016-09-09: qty 15

## 2016-09-09 MED ORDER — PHENYLEPHRINE HCL 10 MG/ML IJ SOLN
INTRAMUSCULAR | Status: AC
  Filled 2016-09-09: qty 1

## 2016-09-09 MED ORDER — CHOLESTYRAMINE LIGHT 4 G PO PACK
4.0000 g | PACK | Freq: Every day | ORAL | Status: DC
  Administered 2016-09-10 – 2016-09-11 (×2): 4 g via ORAL
  Filled 2016-09-09 (×3): qty 1

## 2016-09-09 MED ORDER — CEFAZOLIN SODIUM-DEXTROSE 2-4 GM/100ML-% IV SOLN: 2.0000 g | INTRAVENOUS | Status: DC

## 2016-09-09 MED ORDER — CHLORHEXIDINE GLUCONATE 4 % EX LIQD: 60.0000 mL | Freq: Once | CUTANEOUS | Status: DC

## 2016-09-09 MED ORDER — FENTANYL CITRATE (PF) 100 MCG/2ML IJ SOLN
25.0000 ug | INTRAMUSCULAR | Status: AC | PRN
Start: 2016-09-09 — End: 2016-09-09
  Administered 2016-09-09 (×6): 25 ug via INTRAVENOUS

## 2016-09-09 MED ORDER — EPINEPHRINE PF 1 MG/ML IJ SOLN
INTRAMUSCULAR | Status: DC | PRN
  Administered 2016-09-09: .2 mg via INTRATHECAL
  Administered 2016-09-09: 5 mg via INTRAVENOUS

## 2016-09-09 MED ORDER — BISACODYL 10 MG RE SUPP: 10.0000 mg | Freq: Every day | RECTAL | Status: DC | PRN

## 2016-09-09 MED ORDER — ONDANSETRON HCL 4 MG PO TABS: 4.0000 mg | ORAL_TABLET | Freq: Four times a day (QID) | ORAL | Status: DC | PRN

## 2016-09-09 MED ORDER — BUPIVACAINE HCL (PF) 0.5 % IJ SOLN
INTRAMUSCULAR | Status: AC
  Filled 2016-09-09: qty 10

## 2016-09-09 MED ORDER — NEOMYCIN-POLYMYXIN B GU 40-200000 IR SOLN
Status: DC | PRN
  Administered 2016-09-09: 14 mL

## 2016-09-09 MED ORDER — CEFAZOLIN SODIUM-DEXTROSE 2-3 GM-% IV SOLR
INTRAVENOUS | Status: DC | PRN
  Administered 2016-09-09: 2 g via INTRAVENOUS

## 2016-09-09 MED ORDER — ONDANSETRON HCL 4 MG/2ML IJ SOLN
INTRAMUSCULAR | Status: AC
  Filled 2016-09-09: qty 2

## 2016-09-09 MED ORDER — PROPOFOL 500 MG/50ML IV EMUL
INTRAVENOUS | Status: AC
Start: 2016-09-09 — End: 2016-09-09
  Filled 2016-09-09: qty 50

## 2016-09-09 MED ORDER — LOPERAMIDE HCL 2 MG PO CAPS: 2.0000 mg | ORAL_CAPSULE | Freq: Four times a day (QID) | ORAL | Status: DC | PRN

## 2016-09-09 MED ORDER — EPHEDRINE SULFATE 50 MG/ML IJ SOLN
INTRAMUSCULAR | Status: DC | PRN
  Administered 2016-09-09 (×3): 5 mg via INTRAVENOUS
  Administered 2016-09-09 (×3): 10 mg via INTRAVENOUS
  Administered 2016-09-09: 5 mg via INTRAVENOUS

## 2016-09-09 MED ORDER — ACETAMINOPHEN 10 MG/ML IV SOLN
INTRAVENOUS | Status: AC
  Filled 2016-09-09: qty 100

## 2016-09-09 MED ORDER — PHENYLEPHRINE HCL 10 MG/ML IJ SOLN
INTRAMUSCULAR | Status: DC | PRN
  Administered 2016-09-09 (×4): 100 ug via INTRAVENOUS

## 2016-09-09 MED ORDER — MORPHINE SULFATE (PF) 2 MG/ML IV SOLN
2.0000 mg | INTRAVENOUS | Status: DC | PRN
  Administered 2016-09-10 – 2016-09-11 (×2): 2 mg via INTRAVENOUS
  Filled 2016-09-09 (×2): qty 1

## 2016-09-09 MED ORDER — ONDANSETRON HCL 4 MG/2ML IJ SOLN
4.0000 mg | Freq: Once | INTRAMUSCULAR | Status: AC | PRN
  Administered 2016-09-09: 4 mg via INTRAVENOUS

## 2016-09-09 MED ORDER — BUPIVACAINE HCL (PF) 0.5 % IJ SOLN
INTRAMUSCULAR | Status: DC | PRN
  Administered 2016-09-09: 3 mL via INTRATHECAL

## 2016-09-09 MED ORDER — CEFAZOLIN SODIUM-DEXTROSE 2-4 GM/100ML-% IV SOLN
INTRAVENOUS | Status: AC
  Filled 2016-09-09: qty 100

## 2016-09-09 MED ORDER — ACETAMINOPHEN 10 MG/ML IV SOLN
1000.0000 mg | Freq: Four times a day (QID) | INTRAVENOUS | Status: AC
  Administered 2016-09-10 (×3): 1000 mg via INTRAVENOUS
  Filled 2016-09-09 (×3): qty 100

## 2016-09-09 MED ORDER — BUPIVACAINE HCL (PF) 0.25 % IJ SOLN
INTRAMUSCULAR | Status: AC
  Filled 2016-09-09: qty 60

## 2016-09-09 MED ORDER — ONDANSETRON HCL 4 MG/2ML IJ SOLN
INTRAMUSCULAR | Status: DC | PRN
  Administered 2016-09-09: 4 mg via INTRAVENOUS

## 2016-09-09 MED ORDER — FERROUS SULFATE 325 (65 FE) MG PO TABS
325.0000 mg | ORAL_TABLET | Freq: Two times a day (BID) | ORAL | Status: DC
  Administered 2016-09-10 – 2016-09-12 (×5): 325 mg via ORAL
  Filled 2016-09-09 (×5): qty 1

## 2016-09-09 MED ORDER — PROPOFOL 10 MG/ML IV BOLUS
INTRAVENOUS | Status: AC
  Filled 2016-09-09: qty 40

## 2016-09-09 MED ORDER — FLEET ENEMA 7-19 GM/118ML RE ENEM: 1.0000 | ENEMA | Freq: Once | RECTAL | Status: DC | PRN

## 2016-09-09 MED ORDER — MAGNESIUM HYDROXIDE 400 MG/5ML PO SUSP
30.0000 mL | Freq: Every day | ORAL | Status: DC | PRN
  Administered 2016-09-11: 30 mL via ORAL
  Filled 2016-09-09 (×2): qty 30

## 2016-09-09 MED ORDER — TIMOLOL MALEATE 0.5 % OP SOLN
1.0000 [drp] | Freq: Two times a day (BID) | OPHTHALMIC | Status: DC
  Administered 2016-09-09 – 2016-09-12 (×5): 1 [drp] via OPHTHALMIC
  Filled 2016-09-09: qty 5

## 2016-09-09 MED ORDER — SODIUM CHLORIDE 0.9 % IV SOLN
INTRAVENOUS | Status: DC | PRN
  Administered 2016-09-09: 60 mL

## 2016-09-09 MED ORDER — FAMOTIDINE 20 MG PO TABS
ORAL_TABLET | ORAL | Status: AC
  Filled 2016-09-09: qty 1

## 2016-09-09 MED ORDER — ONDANSETRON HCL 4 MG/2ML IJ SOLN
4.0000 mg | Freq: Four times a day (QID) | INTRAMUSCULAR | Status: DC | PRN
  Administered 2016-09-10: 4 mg via INTRAVENOUS
  Filled 2016-09-09: qty 2

## 2016-09-09 MED ORDER — MENTHOL 3 MG MT LOZG: 1.0000 | LOZENGE | OROMUCOSAL | Status: DC | PRN

## 2016-09-09 MED ORDER — DEXAMETHASONE SODIUM PHOSPHATE 10 MG/ML IJ SOLN
INTRAMUSCULAR | Status: AC
  Filled 2016-09-09: qty 1

## 2016-09-09 MED ORDER — CELECOXIB 200 MG PO CAPS
200.0000 mg | ORAL_CAPSULE | Freq: Two times a day (BID) | ORAL | Status: DC
  Administered 2016-09-09 – 2016-09-12 (×6): 200 mg via ORAL
  Filled 2016-09-09 (×6): qty 1

## 2016-09-09 MED ORDER — ACETAMINOPHEN 325 MG PO TABS: 650.0000 mg | ORAL_TABLET | Freq: Four times a day (QID) | ORAL | Status: DC | PRN

## 2016-09-09 MED ORDER — METOPROLOL SUCCINATE ER 25 MG PO TB24
12.5000 mg | ORAL_TABLET | Freq: Every day | ORAL | Status: DC
  Administered 2016-09-10 – 2016-09-12 (×3): 12.5 mg via ORAL
  Filled 2016-09-09 (×3): qty 1

## 2016-09-09 MED ORDER — SODIUM CHLORIDE 0.9 % IV SOLN
INTRAVENOUS | Status: DC | PRN
  Administered 2016-09-09: 1000 mg via INTRAVENOUS

## 2016-09-09 MED ORDER — FENTANYL CITRATE (PF) 100 MCG/2ML IJ SOLN
INTRAMUSCULAR | Status: DC | PRN
  Administered 2016-09-09 (×2): 25 ug via INTRAVENOUS

## 2016-09-09 MED ORDER — BUPIVACAINE HCL (PF) 0.25 % IJ SOLN
INTRAMUSCULAR | Status: DC | PRN
  Administered 2016-09-09: 60 mL

## 2016-09-09 MED ORDER — PHENOL 1.4 % MT LIQD: 1.0000 | OROMUCOSAL | Status: DC | PRN

## 2016-09-09 MED ORDER — LACTATED RINGERS IV SOLN
INTRAVENOUS | Status: DC
  Administered 2016-09-09: 10:00:00 via INTRAVENOUS

## 2016-09-09 MED ORDER — TRAZODONE HCL 50 MG PO TABS
50.0000 mg | ORAL_TABLET | Freq: Every day | ORAL | Status: DC
Start: 2016-09-09 — End: 2016-09-12
  Administered 2016-09-09 – 2016-09-11 (×3): 50 mg via ORAL
  Filled 2016-09-09 (×3): qty 1

## 2016-09-09 MED ORDER — VITAMIN B-12 1000 MCG PO TABS
1000.0000 ug | ORAL_TABLET | Freq: Every day | ORAL | Status: DC
  Administered 2016-09-12: 1000 ug via ORAL
  Filled 2016-09-09: qty 1

## 2016-09-09 MED ORDER — ENOXAPARIN SODIUM 30 MG/0.3ML ~~LOC~~ SOLN
30.0000 mg | Freq: Two times a day (BID) | SUBCUTANEOUS | Status: DC
  Administered 2016-09-10 – 2016-09-12 (×5): 30 mg via SUBCUTANEOUS
  Filled 2016-09-09 (×5): qty 0.3

## 2016-09-09 MED ORDER — ALUM & MAG HYDROXIDE-SIMETH 200-200-20 MG/5ML PO SUSP: 30.0000 mL | ORAL | Status: DC | PRN

## 2016-09-09 MED ORDER — CEFAZOLIN SODIUM-DEXTROSE 2-4 GM/100ML-% IV SOLN
2.0000 g | Freq: Four times a day (QID) | INTRAVENOUS | Status: AC
  Administered 2016-09-09 – 2016-09-10 (×4): 2 g via INTRAVENOUS
  Filled 2016-09-09 (×3): qty 100

## 2016-09-09 MED ORDER — SENNOSIDES-DOCUSATE SODIUM 8.6-50 MG PO TABS
1.0000 | ORAL_TABLET | Freq: Two times a day (BID) | ORAL | Status: DC
  Administered 2016-09-09 – 2016-09-12 (×5): 1 via ORAL
  Filled 2016-09-09 (×6): qty 1

## 2016-09-09 MED ORDER — FENTANYL CITRATE (PF) 100 MCG/2ML IJ SOLN
INTRAMUSCULAR | Status: AC
  Filled 2016-09-09: qty 2

## 2016-09-09 MED ORDER — METOCLOPRAMIDE HCL 10 MG PO TABS
10.0000 mg | ORAL_TABLET | Freq: Three times a day (TID) | ORAL | Status: AC
  Administered 2016-09-09 – 2016-09-11 (×6): 10 mg via ORAL
  Filled 2016-09-09 (×6): qty 1

## 2016-09-09 MED ORDER — PROPOFOL 10 MG/ML IV BOLUS
INTRAVENOUS | Status: AC
  Filled 2016-09-09: qty 20

## 2016-09-09 MED ORDER — TRANEXAMIC ACID 1000 MG/10ML IV SOLN
1000.0000 mg | Freq: Once | INTRAVENOUS | Status: AC
  Administered 2016-09-09: 1000 mg via INTRAVENOUS
  Filled 2016-09-09: qty 10

## 2016-09-09 MED ORDER — BUPIVACAINE LIPOSOME 1.3 % IJ SUSP
INTRAMUSCULAR | Status: AC
  Filled 2016-09-09: qty 20

## 2016-09-09 MED ORDER — SODIUM CHLORIDE 0.9 % IJ SOLN
INTRAMUSCULAR | Status: AC
  Filled 2016-09-09: qty 50

## 2016-09-09 MED ORDER — DIPHENHYDRAMINE HCL 12.5 MG/5ML PO ELIX: 12.5000 mg | ORAL_SOLUTION | ORAL | Status: DC | PRN

## 2016-09-09 MED ORDER — SODIUM CHLORIDE 0.9 % IV SOLN
INTRAVENOUS | Status: DC
  Administered 2016-09-09: 1000 mL via INTRAVENOUS
  Administered 2016-09-10: 12:00:00 via INTRAVENOUS

## 2016-09-09 MED ORDER — ADULT MULTIVITAMIN W/MINERALS CH
1.0000 | ORAL_TABLET | Freq: Every day | ORAL | Status: DC
  Administered 2016-09-10 – 2016-09-12 (×3): 1 via ORAL
  Filled 2016-09-09 (×3): qty 1

## 2016-09-09 MED ORDER — NEOMYCIN-POLYMYXIN B GU 40-200000 IR SOLN
Status: AC
  Filled 2016-09-09: qty 20

## 2016-09-09 MED ORDER — PROPOFOL 500 MG/50ML IV EMUL
INTRAVENOUS | Status: DC | PRN
  Administered 2016-09-09: 70 ug/kg/min via INTRAVENOUS

## 2016-09-09 MED ORDER — LACTATED RINGERS IV SOLN
INTRAVENOUS | Status: DC | PRN
  Administered 2016-09-09: 11:00:00 via INTRAVENOUS

## 2016-09-09 MED ORDER — ACETAMINOPHEN 10 MG/ML IV SOLN
INTRAVENOUS | Status: DC | PRN
  Administered 2016-09-09: 1000 mg via INTRAVENOUS

## 2016-09-09 MED ORDER — PANTOPRAZOLE SODIUM 40 MG PO TBEC
40.0000 mg | DELAYED_RELEASE_TABLET | Freq: Two times a day (BID) | ORAL | Status: DC
  Administered 2016-09-09 – 2016-09-12 (×6): 40 mg via ORAL
  Filled 2016-09-09 (×6): qty 1

## 2016-09-09 MED ORDER — TRAMADOL HCL 50 MG PO TABS
50.0000 mg | ORAL_TABLET | ORAL | Status: DC | PRN
  Administered 2016-09-12: 100 mg via ORAL
  Filled 2016-09-09: qty 2

## 2016-09-09 MED ORDER — OXYCODONE HCL 5 MG PO TABS
5.0000 mg | ORAL_TABLET | ORAL | Status: DC | PRN
  Administered 2016-09-10 – 2016-09-11 (×6): 10 mg via ORAL
  Administered 2016-09-12: 5 mg via ORAL
  Filled 2016-09-09 (×3): qty 2
  Filled 2016-09-09: qty 1
  Filled 2016-09-09 (×4): qty 2

## 2016-09-09 MED ORDER — FENTANYL CITRATE (PF) 100 MCG/2ML IJ SOLN
INTRAMUSCULAR | Status: AC
Start: 2016-09-09 — End: 2016-09-10
  Filled 2016-09-09: qty 2

## 2016-09-09 MED ORDER — SODIUM CHLORIDE FLUSH 0.9 % IV SOLN
INTRAVENOUS | Status: AC
  Filled 2016-09-09: qty 10

## 2016-09-09 MED ORDER — FLUTICASONE PROPIONATE 50 MCG/ACT NA SUSP
1.0000 | Freq: Every day | NASAL | Status: DC
  Administered 2016-09-11: 1 via NASAL
  Filled 2016-09-09: qty 16

## 2016-09-09 SURGICAL SUPPLY — 61 items
AUTOTRANSFUS HAS 1/8 (MISCELLANEOUS) ×3
BATTERY INSTRU NAVIGATION (MISCELLANEOUS) ×12 IMPLANT
BLADE SAW 1 (BLADE) ×3 IMPLANT
BLADE SAW 1/2 (BLADE) ×3 IMPLANT
BLADE SAW 70X12.5 (BLADE) IMPLANT
CANISTER SUCT 1200ML W/VALVE (MISCELLANEOUS) ×3 IMPLANT
CANISTER SUCT 3000ML (MISCELLANEOUS) ×6 IMPLANT
CAPT KNEE TOTAL 3 ATTUNE ×3 IMPLANT
CATH TRAY METER 16FR LF (MISCELLANEOUS) ×3 IMPLANT
CEMENT HV SMART SET (Cement) ×6 IMPLANT
COOLER POLAR GLACIER W/PUMP (MISCELLANEOUS) ×3 IMPLANT
CUFF TOURN 24 STER (MISCELLANEOUS) ×3 IMPLANT
CUFF TOURN 30 STER DUAL PORT (MISCELLANEOUS) IMPLANT
DRAPE SHEET LG 3/4 BI-LAMINATE (DRAPES) ×3 IMPLANT
DRSG DERMACEA 8X12 NADH (GAUZE/BANDAGES/DRESSINGS) ×3 IMPLANT
DRSG OPSITE POSTOP 4X14 (GAUZE/BANDAGES/DRESSINGS) ×3 IMPLANT
DRSG TEGADERM 4X4.75 (GAUZE/BANDAGES/DRESSINGS) ×3 IMPLANT
DURAPREP 26ML APPLICATOR (WOUND CARE) ×6 IMPLANT
ELECT CAUTERY BLADE 6.4 (BLADE) ×3 IMPLANT
ELECT REM PT RETURN 9FT ADLT (ELECTROSURGICAL) ×3
ELECTRODE REM PT RTRN 9FT ADLT (ELECTROSURGICAL) ×1 IMPLANT
EX-PIN ORTHOLOCK NAV 4X150 (PIN) ×6 IMPLANT
GLOVE BIO SURGEON STRL SZ7 (GLOVE) ×3 IMPLANT
GLOVE BIOGEL M STRL SZ7.5 (GLOVE) ×9 IMPLANT
GLOVE INDICATOR 8.0 STRL GRN (GLOVE) ×3 IMPLANT
GLOVE SURG 9.0 ORTHO LTXF (GLOVE) ×3 IMPLANT
GLOVE SURG ORTHO 9.0 STRL STRW (GLOVE) ×3 IMPLANT
GOWN STRL REUS W/ TWL LRG LVL3 (GOWN DISPOSABLE) ×2 IMPLANT
GOWN STRL REUS W/TWL 2XL LVL3 (GOWN DISPOSABLE) ×3 IMPLANT
GOWN STRL REUS W/TWL LRG LVL3 (GOWN DISPOSABLE) ×4
HANDPIECE INTERPULSE COAX TIP (DISPOSABLE) ×2
HOLDER FOLEY CATH W/STRAP (MISCELLANEOUS) ×3 IMPLANT
HOOD PEEL AWAY FLYTE STAYCOOL (MISCELLANEOUS) ×6 IMPLANT
KIT RM TURNOVER STRD PROC AR (KITS) ×3 IMPLANT
KNIFE SCULPS 14X20 (INSTRUMENTS) ×3 IMPLANT
LABEL OR SOLS (LABEL) ×3 IMPLANT
NDL SAFETY 18GX1.5 (NEEDLE) ×3 IMPLANT
NEEDLE SPNL 20GX3.5 QUINCKE YW (NEEDLE) ×3 IMPLANT
NS IRRIG 500ML POUR BTL (IV SOLUTION) ×3 IMPLANT
PACK TOTAL KNEE (MISCELLANEOUS) ×3 IMPLANT
PAD WRAPON POLAR KNEE (MISCELLANEOUS) ×1 IMPLANT
PIN DRILL QUICK PACK ×3 IMPLANT
PIN FIXATION 1/8DIA X 3INL (PIN) ×3 IMPLANT
SET HNDPC FAN SPRY TIP SCT (DISPOSABLE) ×1 IMPLANT
SOL .9 NS 3000ML IRR  AL (IV SOLUTION) ×2
SOL .9 NS 3000ML IRR UROMATIC (IV SOLUTION) ×1 IMPLANT
SOL PREP PVP 2OZ (MISCELLANEOUS) ×3
SOLUTION PREP PVP 2OZ (MISCELLANEOUS) ×1 IMPLANT
SPONGE DRAIN TRACH 4X4 STRL 2S (GAUZE/BANDAGES/DRESSINGS) ×3 IMPLANT
STAPLER SKIN PROX 35W (STAPLE) ×3 IMPLANT
SUCTION FRAZIER HANDLE 10FR (MISCELLANEOUS) ×2
SUCTION TUBE FRAZIER 10FR DISP (MISCELLANEOUS) ×1 IMPLANT
SUT VIC AB 0 CT1 36 (SUTURE) ×3 IMPLANT
SUT VIC AB 1 CT1 36 (SUTURE) ×6 IMPLANT
SUT VIC AB 2-0 CT2 27 (SUTURE) ×3 IMPLANT
SYR 20CC LL (SYRINGE) ×3 IMPLANT
SYR 30ML LL (SYRINGE) ×6 IMPLANT
SYSTEM AUTOTRANSFUS DUAL TROCR (MISCELLANEOUS) ×1 IMPLANT
TOWEL OR 17X26 4PK STRL BLUE (TOWEL DISPOSABLE) ×3 IMPLANT
TOWER CARTRIDGE SMART MIX (DISPOSABLE) ×3 IMPLANT
WRAPON POLAR PAD KNEE (MISCELLANEOUS) ×3

## 2016-09-09 NOTE — Brief Op Note (Signed)
09/09/2016  2:39 PM  PATIENT:  Kevin Rush  84 y.o. male  PRE-OPERATIVE DIAGNOSIS:  PRIMARY OSTEOARTHRITIS of the right knee  POST-OPERATIVE DIAGNOSIS:  Same  PROCEDURE:  Procedure(s): COMPUTER ASSISTED TOTAL KNEE ARTHROPLASTY (Right)  SURGEON:  Surgeon(s) and Role:    * Dereck Leep, MD - Primary  ASSISTANTS: Vance Peper, PA   ANESTHESIA:   spinal  EBL:  Total I/O In: 1600 [I.V.:1600] Out: 300 [Urine:250; Blood:50]  BLOOD ADMINISTERED:none  DRAINS: 2 medium drains to a reinfusion system   LOCAL MEDICATIONS USED:  MARCAINE    and OTHER Exparel  SPECIMEN:  No Specimen  DISPOSITION OF SPECIMEN:  N/A  COUNTS:  YES  TOURNIQUET:    DICTATION: .Dragon Dictation  PLAN OF CARE: Admit to inpatient   PATIENT DISPOSITION:  PACU - hemodynamically stable.   Delay start of Pharmacological VTE agent (>24hrs) due to surgical blood loss or risk of bleeding: yes

## 2016-09-09 NOTE — Anesthesia Procedure Notes (Signed)
Spinal  Patient location during procedure: OR Staffing Performed: anesthesiologist  Preanesthetic Checklist Completed: patient identified, site marked, surgical consent, pre-op evaluation, timeout performed, IV checked, risks and benefits discussed and monitors and equipment checked Spinal Block Patient position: sitting Prep: Betadine Patient monitoring: heart rate, continuous pulse ox, blood pressure and cardiac monitor Approach: midline Location: L4-5 Injection technique: single-shot Needle Needle type: Quincke  Needle gauge: 22 G Needle length: 9 cm Assessment Sensory level: T6 Additional Notes Initial  Paresthesia on right leg and spont resolved. Negative blood return. Positive free-flowing CSF. Expiration date of kit checked and confirmed. Patient tolerated procedure well, without complications.       

## 2016-09-09 NOTE — Op Note (Signed)
OPERATIVE NOTE  DATE OF SURGERY:  09/09/2016  PATIENT NAME:  Kevin Rush   DOB: 07-Jul-1923  MRN: [id number]  PRE-OPERATIVE DIAGNOSIS: Degenerative arthrosis of the right knee, primary  POST-OPERATIVE DIAGNOSIS:  Same  PROCEDURE:  Right total knee arthroplasty using computer-assisted navigation  SURGEON:  Marciano Sequin. M.D.  ASSISTANT:  Vance Peper, PA (present and scrubbed throughout the case, critical for assistance with exposure, retraction, instrumentation, and closure)  ANESTHESIA: spinal  ESTIMATED BLOOD LOSS: 50 mL  FLUIDS REPLACED: 1600 mL of crystalloid  TOURNIQUET TIME: 81 minutes  DRAINS: 2 medium drains to a reinfusion system  SOFT TISSUE RELEASES: Anterior cruciate ligament, posterior cruciate ligament, deep medial collateral ligament, patellofemoral ligament  IMPLANTS UTILIZED: DePuy Attune size 5 posterior stabilized femoral component (cemented), size 5 rotating platform tibial component (cemented), 35 mm medialized dome patella (cemented), and a 7 mm stabilized rotating platform polyethylene insert.  INDICATIONS FOR SURGERY: Kevin Rush is a 83 y.o. year old male with a long history of progressive knee pain. X-rays demonstrated severe degenerative changes in tricompartmental fashion. The patient had not seen any significant improvement despite conservative nonsurgical intervention. After discussion of the risks and benefits of surgical intervention, the patient expressed understanding of the risks benefits and agree with plans for total knee arthroplasty.   The risks, benefits, and alternatives were discussed at length including but not limited to the risks of infection, bleeding, nerve injury, stiffness, blood clots, the need for revision surgery, cardiopulmonary complications, among others, and they were willing to proceed.  PROCEDURE IN DETAIL: The patient was brought into the operating room and, after adequate spinal anesthesia was achieved, a tourniquet was  placed on the patient's upper thigh. The patient's knee and leg were cleaned and prepped with alcohol and DuraPrep and draped in the usual sterile fashion. A "timeout" was performed as per usual protocol. The lower extremity was exsanguinated using an Esmarch, and the tourniquet was inflated to 300 mmHg. An anterior longitudinal incision was made followed by a standard mid vastus approach. The deep fibers of the medial collateral ligament were elevated in a subperiosteal fashion off of the medial flare of the tibia so as to maintain a continuous soft tissue sleeve. The patella was subluxed laterally and the patellofemoral ligament was incised. Inspection of the knee demonstrated severe degenerative changes with full-thickness loss of articular cartilage. Osteophytes were debrided using a rongeur. Anterior and posterior cruciate ligaments were excised. Two 4.0 mm Schanz pins were inserted in the femur and into the tibia for attachment of the array of trackers used for computer-assisted navigation. Hip center was identified using a circumduction technique. Distal landmarks were mapped using the computer. The distal femur and proximal tibia were mapped using the computer. The distal femoral cutting guide was positioned using computer-assisted navigation so as to achieve a 5 distal valgus cut. The femur was sized and it was felt that a size 5 femoral component was appropriate. A size 5 femoral cutting guide was positioned and the anterior cut was performed and verified using the computer. This was followed by completion of the posterior and chamfer cuts. Femoral cutting guide for the central box was then positioned in the center box cut was performed.  Attention was then directed to the proximal tibia. Medial and lateral menisci were excised. The extramedullary tibial cutting guide was positioned using computer-assisted navigation so as to achieve a 0 varus-valgus alignment and 3 posterior slope. The cut was  performed and verified using the computer.  The proximal tibia was sized and it was felt that a size 5 tibial tray was appropriate. Tibial and femoral trials were inserted followed by insertion of a 7 mm polyethylene insert. This allowed for excellent mediolateral soft tissue balancing both in flexion and in full extension. Finally, the patella was cut and prepared so as to accommodate a 35 mm medialized dome patella. A patella trial was placed and the knee was placed through a range of motion with excellent patellar tracking appreciated. The femoral trial was removed after debridement of posterior osteophytes. The central post-hole for the tibial component was reamed followed by insertion of a keel punch. Tibial trials were then removed. Cut surfaces of bone were irrigated with copious amounts of normal saline with antibiotic solution using pulsatile lavage and then suctioned dry. Polymethylmethacrylate cement was prepared in the usual fashion using a vacuum mixer. Cement was applied to the cut surface of the proximal tibia as well as along the undersurface of a size 5 rotating platform tibial component. Tibial component was positioned and impacted into place. Excess cement was removed using [employer] fast streamer. Cement was then applied to the cut surfaces of the femur as well as along the posterior flanges of the size 5 femoral component. The femoral component was positioned and impacted into place. Excess cement was removed using [employer] fast streamer. A 7 mm polyethylene trial was inserted and the knee was brought into full extension with steady axial compression applied. Finally, cement was applied to the backside of a 35 mm medialized dome patella and the patellar component was positioned and patellar clamp applied. Excess cement was removed using [employer] fast streamer. After adequate curing of the cement, the tourniquet was deflated after a total tourniquet time of 81 minutes. Hemostasis was achieved using electrocautery. The  knee was irrigated with copious amounts of normal saline with antibiotic solution using pulsatile lavage and then suctioned dry. 20 mL of 1.3% Exparel and 60 mL of 0.25% Marcaine in 40 mL of normal saline was injected along the posterior capsule, medial and lateral gutters, and along the arthrotomy site. A 7 mm stabilized rotating platform polyethylene insert was inserted and the knee was placed through a range of motion with excellent mediolateral soft tissue balancing appreciated and excellent patellar tracking noted. 2 medium drains were placed in the wound bed and brought out through separate stab incisions to be attached to a reinfusion system. The medial parapatellar portion of the incision was reapproximated using interrupted sutures of #1 Vicryl. Subcutaneous tissue was approximated in layers using first #0 Vicryl followed #2-0 Vicryl. The skin was approximated with skin staples. A sterile dressing was applied.  The patient tolerated the procedure well and was transported to the recovery room in stable condition.    Kamon P. Holley Bouche., M.D.

## 2016-09-09 NOTE — Anesthesia Preprocedure Evaluation (Signed)
Anesthesia Evaluation  Patient identified by MRN, date of birth, ID band Patient awake    Reviewed: Allergy & Precautions, H&P , NPO status , Patient's Chart, lab work & pertinent test results, reviewed documented beta blocker date and time   Airway Mallampati: II   Neck ROM: full    Dental  (+) Poor Dentition   Pulmonary neg pulmonary ROS,    Pulmonary exam normal        Cardiovascular hypertension, negative cardio ROS Normal cardiovascular exam Rhythm:regular Rate:Normal     Neuro/Psych TIAnegative neurological ROS  negative psych ROS   GI/Hepatic negative GI ROS, Neg liver ROS, GERD  Medicated,  Endo/Other  negative endocrine ROS  Renal/GU Renal diseasenegative Renal ROS  negative genitourinary   Musculoskeletal   Abdominal   Peds  Hematology negative hematology ROS (+)   Anesthesia Other Findings Past Medical History: No date: Actinic keratoses No date: Cervicalgia No date: Colon cancer ([place])     Comment: in remission No date: GERD (gastroesophageal reflux disease) No date: Hypertension No date: Irritable bowel syndrome No date: Plantar fibromatosis Past Surgical History: No date: APPENDECTOMY No date: CARPAL TUNNEL RELEASE Bilateral No date: CHOLECYSTECTOMY No date: COLON RESECTION No date: COLON SURGERY No date: TONSILLECTOMY BMI    Body Mass Index:  22.96 kg/m     Reproductive/Obstetrics negative OB ROS                             Anesthesia Physical Anesthesia Plan  ASA: III  Anesthesia Plan: General and Spinal   Post-op Pain Management:    Induction:   Airway Management Planned:   Additional Equipment:   Intra-op Plan:   Post-operative Plan:   Informed Consent: I have reviewed the patients History and Physical, chart, labs and discussed the procedure including the risks, benefits and alternatives for the proposed anesthesia with the patient or  authorized representative who has indicated his/her understanding and acceptance.   Dental Advisory Given  Plan Discussed with: CRNA  Anesthesia Plan Comments:         Anesthesia Quick Evaluation

## 2016-09-09 NOTE — Transfer of Care (Signed)
Immediate Anesthesia Transfer of Care Note  Patient: Kevin Rush  Procedure(s) Performed: Procedure(s): COMPUTER ASSISTED TOTAL KNEE ARTHROPLASTY (Right)  Patient Location: PACU  Anesthesia Type:Spinal  Level of Consciousness: awake, alert  and oriented  Airway & Oxygen Therapy: Patient Spontanous Breathing and Patient connected to face mask oxygen  Post-op Assessment: Report given to RN and Post -op Vital signs reviewed and stable  Post vital signs: Reviewed and stable  Last Vitals:  Vitals:   09/09/16 0938 09/09/16 1431  BP: 140/76   Pulse: 62   Resp: 16   Temp: 36.4 C (!) 36 C    Last Pain:  Vitals:   09/09/16 1431  TempSrc: Temporal         Complications: No apparent anesthesia complications

## 2016-09-09 NOTE — H&P (Signed)
The patient has been re-examined, and the chart reviewed, and there have been no interval changes to the documented history and physical.    The risks, benefits, and alternatives have been discussed at length. The patient expressed understanding of the risks benefits and agreed with plans for surgical intervention.  Wilford P. Hooten, Jr. M.D.    

## 2016-09-09 NOTE — NC FL2 (Signed)
[place] LEVEL OF CARE SCREENING TOOL     IDENTIFICATION  Patient Name: Kevin Rush Birthdate: Apr 18, 1923 Sex: male Admission Date (Current Location): 09/09/2016  [place] and [place] Number:  Engineering geologist and Address:  [facility], [address], [place], [place] [address]      Provider Number: [id number]  Attending Physician Name and Address:  Dereck Leep, MD  Relative Name and Phone Number:       Current Level of Care: Hospital Recommended Level of Care: [place] Prior Approval Number:    Date Approved/Denied:   PASRR Number:  ([id number])  Discharge Plan: SNF    Current Diagnoses: Patient Active Problem List   Diagnosis Date Noted  . 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 Irritable bowel syndrome 09/09/2016  . Hypertension 09/09/2016  . History of colon cancer 09/09/2016  . Actinic keratoses 09/09/2016  . S/P total knee arthroplasty 09/09/2016  . Pulmonary fibrosis ([place]) 01/11/2016  . Bronchitis 09/25/2015  . Left optic neuritis 06/14/2015  . B12 deficiency 02/21/2015  . Tremor 03/04/2014  . Vertigo 02/21/2014  . Renal insufficiency 02/21/2014  . TIA (transient ischemic attack) 01/07/2014  . Squamous cell carcinoma of skin of face 06/24/2013    Orientation RESPIRATION BLADDER Height & Weight     Self, Time, Situation, Place  Normal Continent Weight: 138 lb (62.6 kg) Height:  5\' 5"  (165.1 cm)  BEHAVIORAL SYMPTOMS/MOOD NEUROLOGICAL BOWEL NUTRITION STATUS   (none)  (none) Continent Diet (Regular Diet )  AMBULATORY STATUS COMMUNICATION OF NEEDS Skin   Extensive Assist Verbally Surgical wounds (Incision: Right Knee )                       Personal Care Assistance Level of Assistance  Bathing, Feeding, Dressing Bathing Assistance: Limited assistance Feeding assistance: Independent Dressing Assistance: Limited assistance     Functional Limitations Info  Sight, Hearing, Speech Sight  Info: Adequate Hearing Info: Adequate Speech Info: Adequate    SPECIAL CARE FACTORS FREQUENCY  PT (By licensed PT), OT (By licensed OT)     PT Frequency:  (5) OT Frequency:  (5)            Contractures      Additional Factors Info  Code Status, Allergies Code Status Info:  (Full Code. ) Allergies Info:  (Nortriptyline, Zithromax Azithromycin)           Current Medications (09/09/2016):  This is the current hospital active medication list Current Facility-Administered Medications  Medication Dose Route Frequency Provider Last Rate Last Dose  . 0.9 %  sodium chloride infusion   Intravenous Continuous Dereck Leep, MD 100 mL/hr at 09/09/16 1854 1,000 mL at 09/09/16 1854  . acetaminophen (OFIRMEV) IV 1,000 mg  1,000 mg Intravenous Q6H Dereck Leep, MD      . acetaminophen (TYLENOL) tablet 650 mg  650 mg Oral Q6H PRN Dereck Leep, MD       Or  . acetaminophen (TYLENOL) suppository 650 mg  650 mg Rectal Q6H PRN Dereck Leep, MD      . alum & mag hydroxide-simeth (MAALOX/MYLANTA) 200-200-20 MG/5ML suspension 30 mL  30 mL Oral Q4H PRN Dereck Leep, MD      . bisacodyl (DULCOLAX) suppository 10 mg  10 mg Rectal Daily PRN Dereck Leep, MD      . ceFAZolin (ANCEF) 2-4 GM/100ML-% IVPB           . ceFAZolin (  ANCEF) IVPB 2g/100 mL premix  2 g Intravenous Q6H Dereck Leep, MD   2 g at 09/09/16 1742  . celecoxib (CELEBREX) capsule 200 mg  200 mg Oral Q12H Dereck Leep, MD   200 mg at 09/09/16 2032  . [START ON 09/10/2016] cholestyramine light (PREVALITE) packet 4 g  4 g Oral QAC breakfast Dereck Leep, MD      . diphenhydrAMINE (BENADRYL) 12.5 MG/5ML elixir 12.5-25 mg  12.5-25 mg Oral Q4H PRN Dereck Leep, MD      . Derrill Memo ON 09/10/2016] enoxaparin (LOVENOX) injection 30 mg  30 mg Subcutaneous Q12H Dereck Leep, MD      . fentaNYL (SUBLIMAZE) 100 MCG/2ML injection           . fentaNYL (SUBLIMAZE) 100 MCG/2ML injection           . [START ON 09/10/2016] ferrous sulfate  tablet 325 mg  325 mg Oral BID WC Dereck Leep, MD      . fluticasone (FLONASE) 50 MCG/ACT nasal spray 1 spray  1 spray Each Nare QHS Dereck Leep, MD      . loperamide (IMODIUM) capsule 2-4 mg  2-4 mg Oral QID PRN Dereck Leep, MD      . magnesium hydroxide (MILK OF MAGNESIA) suspension 30 mL  30 mL Oral Daily PRN Dereck Leep, MD      . menthol-cetylpyridinium (CEPACOL) lozenge 3 mg  1 lozenge Oral PRN Dereck Leep, MD       Or  . phenol (CHLORASEPTIC) mouth spray 1 spray  1 spray Mouth/Throat PRN Dereck Leep, MD      . metoCLOPramide (REGLAN) tablet 10 mg  10 mg Oral TID AC & HS Dereck Leep, MD   10 mg at 09/09/16 2031  . [START ON 09/10/2016] metoprolol succinate (TOPROL-XL) 24 hr tablet 12.5 mg  12.5 mg Oral Daily Dereck Leep, MD      . morphine 2 MG/ML injection 2 mg  2 mg Intravenous Q2H PRN Dereck Leep, MD      . Derrill Memo ON 09/10/2016] multivitamin with minerals tablet 1 tablet  1 tablet Oral Daily Dereck Leep, MD      . ondansetron [facility]) 4 MG/2ML injection           . ondansetron (ZOFRAN) tablet 4 mg  4 mg Oral Q6H PRN Dereck Leep, MD       Or  . ondansetron (ZOFRAN) injection 4 mg  4 mg Intravenous Q6H PRN Dereck Leep, MD      . oxyCODONE (Oxy IR/ROXICODONE) immediate release tablet 5-10 mg  5-10 mg Oral Q4H PRN Dereck Leep, MD      . oxymetazoline (AFRIN) 0.05 % nasal spray 1 spray  1 spray Each Nare QHS Dereck Leep, MD      . pantoprazole (PROTONIX) EC tablet 40 mg  40 mg Oral BID Dereck Leep, MD   40 mg at 09/09/16 2032  . senna-docusate (Senokot-S) tablet 1 tablet  1 tablet Oral BID Dereck Leep, MD   1 tablet at 09/09/16 2031  . sodium phosphate (FLEET) 7-19 GM/118ML enema 1 enema  1 enema Rectal Once PRN Dereck Leep, MD      . timolol (TIMOPTIC) 0.5 % ophthalmic solution 1 drop  1 drop Both Eyes BID Dereck Leep, MD   1 drop at 09/09/16 2035  . traMADol (ULTRAM) tablet 50-100 mg  50-100 mg  Oral Q4H PRN Dereck Leep, MD      .  traZODone (DESYREL) tablet 50 mg  50 mg Oral QHS Dereck Leep, MD   50 mg at 09/09/16 2032  . [START ON 09/10/2016] vitamin B-12 (CYANOCOBALAMIN) tablet 1,000 mcg  1,000 mcg Oral Q lunch Dereck Leep, MD       Facility-Administered Medications Ordered in Other Encounters  Medication Dose Route Frequency Provider Last Rate Last Dose  . acetaminophen (OFIRMEV) IV   Intravenous Anesthesia Intra-op Aline Brochure, CRNA   1,000 mg at 09/09/16 1341  . bupivacaine (MARCAINE) 0.5 % injection    Anesthesia Intra-op Kennon Holter, CRNA   3 mL at 09/09/16 1107  . ceFAZolin (ANCEF) IVPB 2 g/50 mL premix   Intravenous Anesthesia Intra-op Kennon Holter, CRNA   2 g at 09/09/16 1120  . dexamethasone (DECADRON) injection    Anesthesia Intra-op Kennon Holter, CRNA   5 mg at 09/09/16 1115  . ePHEDrine injection   Intravenous Anesthesia Intra-op Kennon Holter, CRNA   10 mg at 09/09/16 1301  . EPINEPHrine (ADRENALIN)    Anesthesia Intra-op Kennon Holter, CRNA   5 mg at 09/09/16 1128  . fentaNYL (SUBLIMAZE) injection    Anesthesia Intra-op Kennon Holter, CRNA   25 mcg at 09/09/16 1128  . glycopyrrolate (ROBINUL) injection    Anesthesia Intra-op Kennon Holter, CRNA   0.1 mg at 09/09/16 1125  . lactated ringers infusion    Continuous PRN Kennon Holter, CRNA      . ondansetron [facility]) injection    Anesthesia Intra-op Kennon Holter, CRNA   4 mg at 09/09/16 1122  . phenylephrine (NEO-SYNEPHRINE) injection   Intravenous Anesthesia Intra-op Kennon Holter, CRNA   100 mcg at 09/09/16 1342  . propofol (DIPRIVAN) 500 MG/50ML infusion    Continuous PRN Aline Brochure, CRNA   Stopped at 09/09/16 1417  . tranexamic acid (CYKLOKAPRON) 1,000 mg in sodium chloride 0.9 % 100 mL IVPB   Intravenous Continuous PRN Kennon Holter, CRNA   1,000 mg at 09/09/16 1140     Discharge Medications: Please see discharge summary for a list of discharge medications.  Relevant Imaging Results:  Relevant Lab Results:   Additional  Information  (SSN: [id number])  Sample, Veronia Beets, LCSW

## 2016-09-10 ENCOUNTER — Encounter: Payer: Self-pay | Admitting: Orthopedic Surgery

## 2016-09-10 LAB — ABO/RH: ABO/RH(D): O POS

## 2016-09-10 LAB — BASIC METABOLIC PANEL
ANION GAP: 8 (ref 5–15)
BUN: 14 mg/dL (ref 6–20)
CHLORIDE: 104 mmol/L (ref 101–111)
CO2: 23 mmol/L (ref 22–32)
Calcium: 9 mg/dL (ref 8.9–10.3)
Creatinine, Ser: 1.18 mg/dL (ref 0.61–1.24)
GFR calc Af Amer: >60 mL/min (ref >60)
GFR calc non Af Amer: 52 mL/min — ABNORMAL LOW (ref >60)
GLUCOSE: 145 mg/dL — AB (ref 65–99)
POTASSIUM: 4.1 mmol/L (ref 3.5–5.1)
Sodium: 135 mmol/L (ref 135–145)

## 2016-09-10 LAB — CBC
HCT: 32.3 % — ABNORMAL LOW (ref 40.0–52.0)
HEMOGLOBIN: 11.3 g/dL — AB (ref 13.0–18.0)
MCH: 34.4 pg — ABNORMAL HIGH (ref 26.0–34.0)
MCHC: 35 g/dL (ref 32.0–36.0)
MCV: 98.1 fL (ref 80.0–100.0)
Platelets: 134 10*3/uL — ABNORMAL LOW (ref 150–440)
RBC: 3.29 MIL/uL — AB (ref 4.40–5.90)
RDW: 13.1 % (ref 11.5–14.5)
WBC: 6.3 10*3/uL (ref 3.8–10.6)

## 2016-09-10 NOTE — Progress Notes (Signed)
Physical Therapy Treatment Patient Details Name: HARLON WILLETTS MRN: [id number] DOB: 19-May-1923 Today's Date: 09/10/2016    History of Present Illness Pt. is a 83 y.o. male who was admitted for a Right TKR    PT Comments    Pt is able to ambulate ~100 ft, has ~90 degrees of flexion, is able to do SLRs and bed mobility independently and generally is doing very well.  He does have a slight inconsistency with ambulation (better than this AM) but never the less was able to maintain consistent walker motion and overall displayed very good safety.  Pt has exceeded typical POD1 goals.   Follow Up Recommendations  Home health PT     Equipment Recommendations       Recommendations for Other Services       Precautions / Restrictions Precautions Precautions: Fall Restrictions RLE Weight Bearing: Weight bearing as tolerated    Mobility  Bed Mobility Overal bed mobility: Independent                Transfers Overall transfer level: Modified independent Equipment used: Rolling walker (2 wheeled) Transfers: Sit to/from Stand Sit to Stand: Min guard         General transfer comment: Pt again able to control rising and descent w/o assist, does have some expected hesitation secondary to knee ROM/pain.   Ambulation/Gait Ambulation/Gait assistance: Min guard Ambulation Distance (Feet): 100 Feet Assistive device: Rolling walker (2 wheeled)       General Gait Details: Pt with more consistent speed and cadence and is able to maintain forward walker motion the entire time, but does have some low grade inconsistencies with R WBing/confidence   Stairs            Wheelchair Mobility    Modified Rankin (Stroke Patients Only)       Balance Overall balance assessment: Modified Independent                                  Cognition Arousal/Alertness: Awake/alert Behavior During Therapy: WFL for tasks assessed/performed Overall Cognitive Status: Within  Functional Limits for tasks assessed                      Exercises Total Joint Exercises Ankle Circles/Pumps: AROM;10 reps Quad Sets: Strengthening;15 reps Gluteal Sets: Strengthening;15 reps Short Arc Quad: Strengthening;15 reps Hip ABduction/ADduction: Strengthening;10 reps Straight Leg Raises: AROM;10 reps Knee Flexion: PROM;10 reps    General Comments        Pertinent Vitals/Pain Pain Score: 4  (increases with activity/ROM)    Home Living                      Prior Function            PT Goals (current goals can now be found in the care plan section) Progress towards PT goals: Progressing toward goals    Frequency    BID      PT Plan Current plan remains appropriate    Co-evaluation             End of Session Equipment Utilized During Treatment: Gait belt Activity Tolerance: Patient tolerated treatment well Patient left: with bed alarm set;with call bell/phone within reach;with family/visitor present     Time: 1431-1511 PT Time Calculation (min) (ACUTE ONLY): 40 min  Charges:  $Gait Training: 8-22 mins $Therapeutic Exercise: 23-37 mins  G Codes:      Kreg Shropshire, DPT 09/10/2016, 5:24 PM

## 2016-09-10 NOTE — Discharge Instructions (Signed)

## 2016-09-10 NOTE — Care Management Important Message (Signed)
Important Message  Patient Details  Name: JAGGER FADLEY MRN: [id number] Date of Birth: 1922/10/26   Medicare Important Message Given:  Yes    Jolly Mango, RN 09/10/2016, 10:50 AM

## 2016-09-10 NOTE — Evaluation (Signed)
Physical Therapy Evaluation Patient Details Name: Kevin Rush MRN: [id number] DOB: 28-Jan-1923 Today's Date: 09/10/2016   History of Present Illness  Pt is a 83 y/o male here s/p R TKA.  Clinical Impression  Pt did well and surpassed typical POD 1 expectations with ambulation, strength, ROM and mobility.  He was able to do 10 SLRs with relative ease, was able to get to EOB w/o assist and though he was initially hesitant and showed some unsteadiness he ultimately became more comfortable with ambulation and did very well with ~50 ft with walker and ability to maintain consistent forward walker motion and cadence.     Follow Up Recommendations Home health PT    Equipment Recommendations       Recommendations for Other Services       Precautions / Restrictions Restrictions RLE Weight Bearing: Weight bearing as tolerated      Mobility  Bed Mobility Overal bed mobility: Independent             General bed mobility comments: Pt able to rise to sitting EOB w/o assist  Transfers Overall transfer level: Modified independent Equipment used: Rolling walker (2 wheeled) Transfers: Sit to/from Stand Sit to Stand: Min guard         General transfer comment: Pt is able to rise to standing w/o direct assist, but did need some extra cuing and encouragement along with assist for set up and appropriate UE/walker use.   Ambulation/Gait Ambulation/Gait assistance: Min guard Ambulation Distance (Feet): 50 Feet Assistive device: Rolling walker (2 wheeled)       General Gait Details: Pt initially slow, hesitant and somewhat unsteady but with increased time ambulating he showed increased confidence, cadence and speed.    Stairs            Wheelchair Mobility    Modified Rankin (Stroke Patients Only)       Balance Overall balance assessment: Modified Independent                                           Pertinent Vitals/Pain Pain Assessment: 0-10 Pain  Score: 6     Home Living Family/patient expects to be discharged to:: Private residence Living Arrangements: Spouse/significant other Available Help at Discharge: Family Type of Home: Independent living facility (apartment at [facility] [place]) Home Access: Level entry       Home Equipment: Environmental consultant - 4 wheels;Cane - single point;Wheelchair - manual      Prior Function Level of Independence: Independent         Comments: reports that he normally does not need a rollator     Hand Dominance        Extremity/Trunk Assessment   Upper Extremity Assessment Upper Extremity Assessment: Overall WFL for tasks assessed (age appropriate deficits)    Lower Extremity Assessment Lower Extremity Assessment: Overall WFL for tasks assessed;RLE deficits/detail RLE Deficits / Details: Pt is able to do SLRs w/o hesitation and generally did quite well with better than typical post op strength/movement       Communication   Communication: No difficulties  Cognition Arousal/Alertness: Awake/alert Behavior During Therapy: WFL for tasks assessed/performed Overall Cognitive Status: Within Functional Limits for tasks assessed                      General Comments      Exercises Total Joint Exercises  Ankle Circles/Pumps: AROM;10 reps Quad Sets: Strengthening;10 reps Gluteal Sets: Strengthening;10 reps Short Arc Quad: AROM;Strengthening;10 reps Heel Slides: AROM;10 reps;Strengthening Hip ABduction/ADduction: Strengthening;10 reps Knee Flexion: PROM;10 reps Goniometric ROM: 0-84   Assessment/Plan    PT Assessment Patient needs continued PT services  PT Problem List Decreased strength;Decreased range of motion;Decreased activity tolerance;Decreased balance;Decreased mobility;Decreased coordination;Decreased safety awareness;Decreased knowledge of use of DME;Decreased knowledge of precautions;Pain          PT Treatment Interventions Gait training;Functional mobility  training;Therapeutic activities;DME instruction;Therapeutic exercise;Balance training;Neuromuscular re-education;Patient/family education    PT Goals (Current goals can be found in the Care Plan section)  Acute Rehab PT Goals Patient Stated Goal: Get back to walking better PT Goal Formulation: With patient Time For Goal Achievement: 09/24/16 Potential to Achieve Goals: Good    Frequency BID   Barriers to discharge        Co-evaluation               End of Session Equipment Utilized During Treatment: Gait belt Activity Tolerance: Patient tolerated treatment well Patient left: with chair alarm set;with call bell/phone within reach           Time: 0821-0857 PT Time Calculation (min) (ACUTE ONLY): 36 min   Charges:   PT Evaluation $PT Eval Low Complexity: 1 Procedure PT Treatments $Therapeutic Exercise: 8-22 mins   PT G Codes:        Kreg Shropshire, DPT 09/10/2016, 10:33 AM

## 2016-09-10 NOTE — Clinical Social Work Placement (Signed)
   CLINICAL SOCIAL WORK PLACEMENT  NOTE  Date:  09/10/2016  Patient Details  Name: Kevin Rush MRN: [id number] Date of Birth: 1923-05-28  Clinical Social Work is seeking post-discharge placement for this patient at the [place] level of care (*CSW will initial, date and re-position this form in  chart as items are completed):  Yes   Patient/family provided with [facility]'s list of facilities offering this level of care within the geographic area requested by the patient (or if unable, by the patient's family).  Yes   Patient/family informed of their freedom to choose among providers that offer the needed level of care, that participate in Medicare, Medicaid or managed care program needed by the patient, have an available bed and are willing to accept the patient.  Yes   Patient/family informed of [facility]'s ownership interest in [facility] and [facility], as well as of the fact that they are under no obligation to receive care at these facilities.  PASRR submitted to EDS on 09/10/16     PASRR number received on 09/10/16     Existing PASRR number confirmed on       FL2 transmitted to all facilities in geographic area requested by pt/family on 09/10/16     FL2 transmitted to all facilities within larger geographic area on       Patient informed that his/her managed care company has contracts with or will negotiate with certain facilities, including the following:        Yes   Patient/family informed of bed offers received.  Patient chooses bed at  [facility])     Physician recommends and patient chooses bed at      Patient to be transferred to   on  .  Patient to be transferred to facility by       Patient family notified on   of transfer.  Name of family member notified:        PHYSICIAN       Additional Comment:    _______________________________________________ Mykell Rawl, Veronia Beets, LCSW 09/10/2016,  2:53 PM

## 2016-09-10 NOTE — Evaluation (Signed)
Occupational Therapy Evaluation Patient Details Name: Kevin Rush MRN: [id number] DOB: 11-Dec-1922 Today's Date: 09/10/2016    History of Present Illness Pt. is a 83 y.o. male who was admitted for a Right TKR   Clinical Impression   Pt. Is a 83 y.o. male who was admitted for a right TKR. Pt presents with limited ROM, Pain, weakness, and impaired functional mobility which hinder his ability to complete ADL and IADL tasks. Pt. could benefit from skilled OT services to review A/E use for LE ADLs, to review necessary home modifications, and to improve functional mobility for ADL/IADLs in order to work towards regaining Independence with ADL/IADLs. Pt. Plans to go to SNF at [facility]. Follow-up OT services are warranted.    Follow Up Recommendations  SNF    Equipment Recommendations       Recommendations for Other Services       Precautions / Restrictions Restrictions Weight Bearing Restrictions: Yes RLE Weight Bearing: Weight bearing as tolerated              ADL Overall ADL's : Needs assistance/impaired Eating/Feeding: Set up   Grooming: Set up               Lower Body Dressing: Moderate assistance                 General ADL Comments: pt. education was provided about A/E use for LE ADLs.     Vision     Perception     Praxis      Pertinent Vitals/Pain Pain Assessment: 0-10 Pain Score: 6  Pain Intervention(s): Limited activity within patient's tolerance;Repositioned;Monitored during session     Hand Dominance Left   Extremity/Trunk Assessment Upper Extremity Assessment Upper Extremity Assessment: Overall WFL for tasks assessed         Communication Communication Communication: No difficulties   Cognition Arousal/Alertness: Awake/alert Behavior During Therapy: WFL for tasks assessed/performed Overall Cognitive Status: Within Functional Limits for tasks assessed                     General Comments       Exercises Exercises:  Total Joint     Shoulder Instructions      Home Living Family/patient expects to be discharged to:: Private residence Living Arrangements: Spouse/significant other Available Help at Discharge: Family Type of Home: Independent living facility Home Access: Level entry     Home Layout: One [place]: Environmental consultant - 4 wheels;Wheelchair - manual          Prior Functioning/Environment Level of Independence: Independent     Pt. Walks to the dining cafe using a rollator.   Comments: reports that he normally does not need a rollator        OT Problem List: Decreased strength;Decreased knowledge of use of DME or AE;Pain   OT Treatment/Interventions: Self-care/ADL training    OT Goals(Current goals can be found in the care plan section) Acute Rehab OT Goals Patient Stated Goal: To regain independence OT Goal Formulation: With patient Potential to Achieve Goals: Good  OT Frequency: Min 1X/week   Barriers to D/C:            Co-evaluation              End of Session    Activity Tolerance: Patient tolerated treatment well Patient left:  in chair, call bell near by, and alarm in place.  Time: [id number] OT Time Calculation (min): 35 min Charges:  OT General Charges $OT Visit: 1 Procedure OT Evaluation $OT Eval Moderate Complexity: 1 Procedure OT Treatments $Self Care/Home Management : 8-22 mins G-Codes:    Harrel Carina, MS, OTR/L 09/10/2016, 10:58 AM

## 2016-09-10 NOTE — Anesthesia Postprocedure Evaluation (Signed)
Anesthesia Post Note  Patient: Kevin Rush  Procedure(s) Performed: Procedure(s) (LRB): COMPUTER ASSISTED TOTAL KNEE ARTHROPLASTY (Right)  Patient location during evaluation: Nursing Unit Anesthesia Type: Spinal Level of consciousness: oriented and awake and alert Pain management: pain level controlled Vital Signs Assessment: post-procedure vital signs reviewed and stable Respiratory status: spontaneous breathing, respiratory function stable and patient connected to nasal cannula oxygen Cardiovascular status: blood pressure returned to baseline and stable Postop Assessment: no headache, no backache and patient able to bend at knees Anesthetic complications: no     Last Vitals:  Vitals:   09/10/16 0041 09/10/16 0531  BP: 109/71 112/69  Pulse: (!) 59 (!) 59  Resp:  19  Temp:  36.5 C    Last Pain:  Vitals:   09/10/16 0752  TempSrc:   PainSc: 5                  Precious Haws Sena Clouatre

## 2016-09-10 NOTE — Discharge Summary (Signed)
Physician Discharge Summary  Patient ID: Kevin Rush MRN: [id number] DOB/AGE: 03-17-1923 83 y.o.  Admit date: 09/09/2016 Discharge date: 09/11/2016  Admission Diagnoses:  PRIMARY OSTEOARTHRITIS   Discharge Diagnoses: Patient Active Problem List   Diagnosis Date Noted  . 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 Irritable bowel syndrome 09/09/2016  . Hypertension 09/09/2016  . History of colon cancer 09/09/2016  . Actinic keratoses 09/09/2016  . S/P total knee arthroplasty 09/09/2016  . Pulmonary fibrosis ([place]) 01/11/2016  . Bronchitis 09/25/2015  . Left optic neuritis 06/14/2015  . B12 deficiency 02/21/2015  . Tremor 03/04/2014  . Vertigo 02/21/2014  . Renal insufficiency 02/21/2014  . TIA (transient ischemic attack) 01/07/2014  . Squamous cell carcinoma of skin of face 06/24/2013    Past Medical History:  Diagnosis Date  . Actinic keratoses   . Cervicalgia   . Colon cancer ([place])    in remission  . GERD (gastroesophageal reflux disease)   . Hypertension   . Irritable bowel syndrome   . Plantar fibromatosis      Transfusion: No transfusions given doing this admission   Consultants (if any):  case management for home health assistance  Discharged Condition: Improved  Hospital Course: Kevin Rush is an 83 y.o. male who was admitted 09/09/2016 with a diagnosis of degenerative arthrosis right knee and went to the operating room on 09/09/2016 and underwent the above named procedures.    Surgeries:Procedure(s): COMPUTER ASSISTED TOTAL KNEE ARTHROPLASTY on 09/09/2016  PRE-OPERATIVE DIAGNOSIS: Degenerative arthrosis of the right knee, primary  POST-OPERATIVE DIAGNOSIS:  Same  PROCEDURE:  Right total knee arthroplasty using computer-assisted navigation  SURGEON:  Marciano Sequin. M.D.  ASSISTANT:  Vance Peper, PA (present and scrubbed throughout the case, critical for assistance with exposure, retraction, instrumentation, and closure)  ANESTHESIA:  spinal  ESTIMATED BLOOD LOSS: 50 mL  FLUIDS REPLACED: 1600 mL of crystalloid  TOURNIQUET TIME: 81 minutes  DRAINS: 2 medium drains to a reinfusion system  SOFT TISSUE RELEASES: Anterior cruciate ligament, posterior cruciate ligament, deep medial collateral ligament, patellofemoral ligament  IMPLANTS UTILIZED: DePuy Attune size 5 posterior stabilized femoral component (cemented), size 5 rotating platform tibial component (cemented), 35 mm medialized dome patella (cemented), and a 7 mm stabilized rotating platform polyethylene insert.  INDICATIONS FOR SURGERY: Kevin Rush is a 83 y.o. year old male with a long history of progressive knee pain. X-rays demonstrated severe degenerative changes in tricompartmental fashion. The patient had not seen any significant improvement despite conservative nonsurgical intervention. After discussion of the risks and benefits of surgical intervention, the patient expressed understanding of the risks benefits and agree with plans for total knee arthroplasty.   The risks, benefits, and alternatives were discussed at length including but not limited to the risks of infection, bleeding, nerve injury, stiffness, blood clots, the need for revision surgery, cardiopulmonary complications, among others, and they were willing to proceed.  Patient tolerated the surgery well. No complications .Patient was taken to PACU where she was stabilized and then transferred to the orthopedic floor.  Patient started on Lovenox 30 mg q 12 hrs. Foot pumps applied bilaterally at 80 mm hgb. Heels elevated off bed with rolled towels. No evidence of DVT. Calves non tender. Negative [place]oman. Physical therapy started on day #1 for gait training and transfer with OT starting on  day #1 for ADL and assisted devices. Patient has done well with therapy. Ambulated greater than 200 feet upon being discharged. Was able to ascend and descend 4 steps safely and  independently  Patient's IV and  Foley were discontinued on day #1 with Hemovac being discontinued on day #2 along with the dressing being changed prior to patient being discharged to home   He was given perioperative antibiotics:  Anti-infectives    Start     Dose/Rate Route Frequency Ordered Stop   09/09/16 1745  ceFAZolin (ANCEF) IVPB 2g/100 mL premix     2 g 200 mL/hr over 30 Minutes Intravenous Every 6 hours 09/09/16 1731 09/10/16 1234   09/09/16 1743  ceFAZolin (ANCEF) 2-4 GM/100ML-% IVPB    Comments:  Loren Racer: cabinet override      09/09/16 1743 09/10/16 0544   09/09/16 0923  ceFAZolin (ANCEF) 2-4 GM/100ML-% IVPB    Comments:  Jola Baptist: cabinet override      09/09/16 0923 09/09/16 2129   09/09/16 0335  ceFAZolin (ANCEF) IVPB 2g/100 mL premix  Status:  Discontinued     2 g 200 mL/hr over 30 Minutes Intravenous On call to O.R. 09/09/16 [id number] 09/09/16 0934    .  He was fitted with AV 1 compression foot pump devices, instructed on heel pumps, early ambulation, and TED stockings bilaterally for DVT prophylaxis.  He benefited maximally from the hospital stay and there were no complications.    Recent vital signs:  Vitals:   09/12/16 0454 09/12/16 0822  BP: 117/75 121/66  Pulse: 64 63  Resp: 18 16  Temp: 97.6 F (36.4 C) 97.8 F (36.6 C)    Recent laboratory studies:  Lab Results  Component Value Date   HGB 10.7 (L) 09/11/2016   HGB 11.3 (L) 09/10/2016   HGB 13.2 08/28/2016   Lab Results  Component Value Date   WBC 5.4 09/11/2016   PLT 126 (L) 09/11/2016   Lab Results  Component Value Date   INR 1.04 08/28/2016   Lab Results  Component Value Date   NA 134 (L) 09/11/2016   K 4.1 09/11/2016   CL 105 09/11/2016   CO2 26 09/11/2016   BUN 19 09/11/2016   CREATININE 1.29 (H) 09/11/2016   GLUCOSE 99 09/11/2016    Discharge Medications:   Allergies as of 09/12/2016      Reactions   Nortriptyline Shortness Of Breath, Other (See Comments)   ENERGY LOSS   Zithromax [azithromycin]  Anaphylaxis      Medication List    TAKE these medications   acetaminophen 500 MG tablet Commonly known as:  TYLENOL Take 500 mg by mouth every 6 (six) hours as needed (for pain/knee pain).   aspirin EC 325 MG tablet Take 325 mg by mouth daily.   cholestyramine light 4 GM/DOSE powder Commonly known as:  PREVALITE Take 4 g by mouth daily before breakfast.   enoxaparin 40 MG/0.4ML injection Commonly known as:  LOVENOX Inject 0.4 mLs (40 mg total) into the skin daily.   feeding supplement (ENSURE ENLIVE) Liqd Take 237 mLs by mouth daily.   fluticasone 50 MCG/ACT nasal spray Commonly known as:  FLONASE Place 1 spray into both nostrils at bedtime.   loperamide 2 MG capsule Commonly known as:  IMODIUM Take 2-4 mg by mouth 4 (four) times daily as needed for diarrhea or loose stools.   metoprolol succinate 25 MG 24 hr tablet Commonly known as:  TOPROL-XL Take 12.5 mg by mouth daily.   MUCINEX NASAL SPRAY MOISTURE 0.05 % nasal spray Generic drug:  oxymetazoline Place 1 spray into both nostrils at bedtime.   multivitamin with minerals Tabs tablet Take 1 tablet by  mouth daily.   omeprazole 20 MG capsule Commonly known as:  PRILOSEC Take 20 mg by mouth daily before breakfast.   oxyCODONE 5 MG immediate release tablet Commonly known as:  Oxy IR/ROXICODONE Take 1-2 tablets (5-10 mg total) by mouth every 4 (four) hours as needed for severe pain or breakthrough pain.   RA VITAMIN B-12 TR 1000 MCG Tbcr Generic drug:  Cyanocobalamin Take 1,000 mcg by mouth daily with lunch.   timolol 0.5 % ophthalmic solution Commonly known as:  TIMOPTIC Place 1 drop into both eyes 2 (two) times daily.   traMADol 50 MG tablet Commonly known as:  ULTRAM Take 1-2 tablets (50-100 mg total) by mouth every 4 (four) hours as needed for moderate pain.   traZODone 50 MG tablet Commonly known as:  DESYREL Take 50 mg by mouth at bedtime.            Durable Medical Equipment         Start     Ordered   09/09/16 1913  DME Walker rolling  Once    Question:  Patient needs a walker to treat with the following condition  Answer:  Total knee replacement status   09/09/16 1912   09/09/16 1913  DME Bedside commode  Once    Question:  Patient needs a bedside commode to treat with the following condition  Answer:  Total knee replacement status   09/09/16 1912      Diagnostic Studies: Dg Knee Right Port  Result Date: 09/09/2016 CLINICAL DATA:  Total knee replacement. EXAM: PORTABLE RIGHT KNEE - 1-2 VIEW COMPARISON:  None. FINDINGS: The components of the total knee prosthesis appear in excellent position. 2 soft tissue drains in place. IMPRESSION: Satisfactory appearance of the right knee after total knee replacement. Electronically Signed   By: Lorriane Shire M.D.   On: 09/09/2016 14:55    Disposition: 01-Home or Self Care  Discharge Instructions    Diet - low sodium heart healthy    Complete by:  As directed    Increase activity slowly    Complete by:  As directed        Contact information for follow-up providers    WOLFE,JON R., PA On 09/24/2016.   Specialty:  Physician Assistant Why:  at 1:15pm Contact information: [place] [address] [phone]        Dereck Leep, MD On 10/22/2016.   Specialty:  Orthopedic Surgery Why:  at 9:45am Contact information: [address] [facility] [place] [address] [phone]            Contact information for after-discharge care    [facility] .   Specialty:  [place] information: [address] [place] [phone]                   Signed: JEYREN, LYNDS 09/12/2016, 9:57 AM

## 2016-09-10 NOTE — Care Management Note (Signed)
Case Management Note  Patient Details  Name: Kevin Rush MRN: [id number] Date of Birth: [birth year]  Subjective/Objective:   RNCM consult for discharge planning. Met with patient at bedside. He lives in the [facility] independent living building. Prefers to go to the [facility] for [facility]. CSW updated. Will sign off. Please reconsult if needed.                  Action/Plan:   Expected Discharge Date:  09/11/16               Expected Discharge Plan:  Skilled Nursing Facility  In-House Referral:  Clinical Social Work  Discharge planning Services  CM Consult  Post Acute Care Choice:    Choice offered to:  Patient  DME Arranged:    DME Agency:     HH Arranged:    [place] Agency:     Status of Service:  Completed, signed off  If discussed at [employer], dates discussed:    Additional Comments:  Jolly Mango, RN 09/10/2016, 11:21 AM

## 2016-09-10 NOTE — Progress Notes (Signed)
   Subjective: 1 Day Post-Op Procedure(s) (LRB): COMPUTER ASSISTED TOTAL KNEE ARTHROPLASTY (Right) Patient reports pain as 4 on 0-10 scale.   Patient is well, and has had no acute complaints or problems We will start therapy today.  Plan is to go Home after hospital stay. no nausea and no vomiting Patient denies any chest pains or shortness of breath. Objective: Vital signs in last 24 hours: Temp:  [96.1 F (35.6 C)-97.9 F (36.6 C)] 97.7 F (36.5 C) (01/16 0531) Pulse Rate:  [59-88] 59 (01/16 0531) Resp:  [14-25] 19 (01/16 0531) BP: (95-146)/(60-89) 112/69 (01/16 0531) SpO2:  [90 %-100 %] 97 % (01/16 0531) FiO2 (%):  [2 %-28 %] 2 % (01/16 0041) Weight:  [62.6 kg (138 lb)] 62.6 kg (138 lb) (01/15 0938) Heels are non tender and elevated off the bed using rolled towels along with bone foam  Intake/Output from previous day: 01/15 0701 - 01/16 0700 In: 1800 [I.V.:1800] Out: 1390 [Urine:1145; Drains:195; Blood:50] Intake/Output this shift: No intake/output data recorded.   Recent Labs  09/10/16 0517  HGB 11.3*    Recent Labs  09/10/16 0517  WBC 6.3  RBC 3.29*  HCT 32.3*  PLT 134*    Recent Labs  09/10/16 0517  NA 135  K 4.1  CL 104  CO2 23  BUN 14  CREATININE 1.18  GLUCOSE 145*  CALCIUM 9.0   No results for input(s): LABPT, INR in the last 72 hours.  EXAM General - Patient is Alert, Appropriate and Oriented Extremity - Neurologically intact Neurovascular intact Sensation intact distally Intact pulses distally Dorsiflexion/Plantar flexion intact No cellulitis present Compartment soft Dressing - dressing C/D/I Motor Function - intact, moving foot and toes well on exam.    Past Medical History:  Diagnosis Date  . Actinic keratoses   . Cervicalgia   . Colon cancer ([place])    in remission  . GERD (gastroesophageal reflux disease)   . Hypertension   . Irritable bowel syndrome   . Plantar fibromatosis     Assessment/Plan: 1 Day Post-Op  Procedure(s) (LRB): COMPUTER ASSISTED TOTAL KNEE ARTHROPLASTY (Right) Active Problems:   S/P total knee arthroplasty  Estimated body mass index is 22.96 kg/m as calculated from the following:   Height as of this encounter: 5\' 5"  (1.651 m).   Weight as of this encounter: 62.6 kg (138 lb). Advance diet Up with therapy D/C IV fluids Plan for discharge tomorrow Discharge home with home health  Labs: Were reviewed DVT Prophylaxis - Lovenox, Foot Pumps and TED hose Weight-Bearing as tolerated to right leg D/C O2 and Pulse OX and try on Room Air Begin working on bowel movement Labs in the a.m.  Jillyn Ledger. [place] 09/10/2016, 7:30 AM

## 2016-09-10 NOTE — Clinical Social Work Note (Signed)
Clinical Social Work Assessment  Patient Details  Name: Kevin Rush MRN: [id number] Date of Birth: May 11, 1923  Date of referral:  09/10/16               Reason for consult:  Facility Placement                Permission sought to share information with:  Chartered certified accountant granted to share information::  Yes, Verbal Permission Granted  Name::      [place]::     Relationship::     Contact Information:     Housing/Transportation Living arrangements for the past 2 months:  [place] of Information:  Patient, Spouse Patient Interpreter Needed:  None Criminal Activity/Legal Involvement Pertinent to Current Situation/Hospitalization:  No - Comment as needed Significant Relationships:  Spouse, Adult Children Lives with:  Spouse Do you feel safe going back to the place where you live?  Yes Need for family participation in patient care:  Yes (Comment)  Care giving concerns:  Patient is a resident at [name] independent living with his wife Kevin Rush.     Social Worker assessment / plan:  Holiday representative (CSW) received SNF consult. PT is recommending home health, however patient would like to go to [facility] since he lives at [facility]. CSW met with patient and his wife Kevin Rush was at bedside. CSW introduced self and explained role of CSW department. Patient reported that him and his wife live in the independent living section at [facility]. Per patient he has arranged to go to the [facility] unit at [facility]. CSW explained that he will require a 3 night qualifying inpatient stay in order for Medicare to cover SNF. He was admitted to inpatient on 09/09/16. Patient verbalized his understanding. Wife asked about transportation to [facility]. CSW explained that EMS or family can transport. Patient prefers EMS. [facility] admissions coordinator at [facility] is aware of above. CSW  will continue to follow and assist as needed.    Employment status:  Retired Forensic scientist:  Medicare PT Recommendations:  Home with [facility] / Referral to community resources:  [place]  Patient/Family's Response to care:  Patient and his wife are agreeable for patient to go to [facility].   Patient/Family's Understanding of and Emotional Response to Diagnosis, Current Treatment, and Prognosis:  Patient and wife were very pleasant and thanked CSW for assistance.   Emotional Assessment Appearance:  Appears stated age Attitude/Demeanor/Rapport:    Affect (typically observed):  Accepting, Adaptable, Pleasant Orientation:  Oriented to Self, Oriented to Place, Oriented to  Time, Oriented to Situation Alcohol / Substance use:  Not Applicable Psych involvement (Current and /or in the community):  No (Comment)  Discharge Needs  Concerns to be addressed:  Discharge Planning Concerns Readmission within the last 30 days:  No Current discharge risk:  Dependent with Mobility Barriers to Discharge:  Continued Medical Work up   [employer], Veronia Beets, LCSW 09/10/2016, 2:57 PM

## 2016-09-10 NOTE — Progress Notes (Signed)
Pt performed foot dangle, tolerated well. Zero knee applied. Pt is alert and oriented. Warm toes and pedal pulses 2+ bilaterally. Can wiggle toes and has full sensation bilaterally. Pt achieved 1800 ml on incentive spirometer. Foley removed at 0700. No adventitious lung or heart sounds. VS stable.

## 2016-09-11 ENCOUNTER — Encounter
Admission: RE | Admit: 2016-09-11 | Discharge: 2016-09-11 | Disposition: A | Discharge status: Home / Self Care | Payer: Medicare Other | Source: Ambulatory Visit | Attending: Internal Medicine | Admitting: Internal Medicine

## 2016-09-11 LAB — BASIC METABOLIC PANEL
Anion gap: 3 — ABNORMAL LOW (ref 5–15)
BUN: 19 mg/dL (ref 6–20)
CO2: 26 mmol/L (ref 22–32)
Calcium: 8.9 mg/dL (ref 8.9–10.3)
Chloride: 105 mmol/L (ref 101–111)
Creatinine, Ser: 1.29 mg/dL — ABNORMAL HIGH (ref 0.61–1.24)
GFR calc Af Amer: 54 mL/min — ABNORMAL LOW (ref >60)
GFR calc non Af Amer: 47 mL/min — ABNORMAL LOW (ref >60)
Glucose, Bld: 99 mg/dL (ref 65–99)
Potassium: 4.1 mmol/L (ref 3.5–5.1)
Sodium: 134 mmol/L — ABNORMAL LOW (ref 135–145)

## 2016-09-11 LAB — CBC
HCT: 31.5 % — ABNORMAL LOW (ref 40.0–52.0)
Hemoglobin: 10.7 g/dL — ABNORMAL LOW (ref 13.0–18.0)
MCH: 33.8 pg (ref 26.0–34.0)
MCHC: 34.1 g/dL (ref 32.0–36.0)
MCV: 99 fL (ref 80.0–100.0)
Platelets: 126 10*3/uL — ABNORMAL LOW (ref 150–440)
RBC: 3.18 MIL/uL — ABNORMAL LOW (ref 4.40–5.90)
RDW: 13.1 % (ref 11.5–14.5)
WBC: 5.4 10*3/uL (ref 3.8–10.6)

## 2016-09-11 NOTE — Progress Notes (Addendum)
Patient will D/C to [facility] tomorrow pending medical clearance. [facility] admissions coordinator at [facility] is aware of above. Patient is aware of above. CSW also made patient aware that [facility] does not have a room in the front as he requested. Patient verbalized his understanding. Clinical Social Worker (CSW) will continue to follow and assist as needed.   [employer], LCSW [phone]

## 2016-09-11 NOTE — Progress Notes (Signed)
Physical Therapy Treatment Patient Details Name: Kevin Rush MRN: [id number] DOB: [birth year] Today's Date: 09/11/2016    History of Present Illness Pt. is a 83 y.o. male who was admitted for a Right TKR    PT Comments    Pt in bed ready for session.  Participated in exercises as described below. 0-109 AAROM.  Bed mobility without assist.  Pt able to ambulate to/from rehab gym with walker and min guard.  Pt reported no stairs at home or to enter and declined trials.  Pt was OOB 30 minutes this am before requesting to return to bed.  "I just don't feel good".  Assisted back to bed per his request.  Fatigues with gait.   Follow Up Recommendations  Home health PT     Equipment Recommendations  Rolling walker with 5" wheels    Recommendations for Other Services       Precautions / Restrictions Precautions Precautions: Fall Restrictions Weight Bearing Restrictions: Yes RLE Weight Bearing: Weight bearing as tolerated    Mobility  Bed Mobility Overal bed mobility: Independent                Transfers Overall transfer level: Modified independent Equipment used: Rolling walker (2 wheeled) Transfers: Sit to/from Stand Sit to Stand: Min guard            Ambulation/Gait Ambulation/Gait assistance: Min guard Ambulation Distance (Feet): 110 Feet Assistive device: Rolling walker (2 wheeled) Gait Pattern/deviations: Step-to pattern   Gait velocity interpretation: Below normal speed for age/gender General Gait Details: 110' x 2 - to/from rehab gym   Stairs            Wheelchair Mobility    Modified Rankin (Stroke Patients Only)       Balance Overall balance assessment: Modified Independent                                  Cognition Arousal/Alertness: Awake/alert Behavior During Therapy: WFL for tasks assessed/performed Overall Cognitive Status: Within Functional Limits for tasks assessed                      Exercises Total  Joint Exercises Ankle Circles/Pumps: AROM;10 reps Quad Sets: Strengthening;15 reps Gluteal Sets: Strengthening;15 reps Short Arc Quad: Strengthening;15 reps Heel Slides: AROM;10 reps;Strengthening Hip ABduction/ADduction: Strengthening;10 reps Straight Leg Raises: AROM;10 reps Knee Flexion: PROM;10 reps Goniometric ROM: 0-109    General Comments        Pertinent Vitals/Pain Pain Assessment: 0-10 Pain Score: 2  Pain Location: R knee Pain Descriptors / Indicators: Sore;Operative site guarding Pain Intervention(s): Limited activity within patient's tolerance;Ice applied    Home Living                      Prior Function            PT Goals (current goals can now be found in the care plan section) Progress towards PT goals: Progressing toward goals    Frequency    BID      PT Plan Current plan remains appropriate    Co-evaluation             End of Session Equipment Utilized During Treatment: Gait belt Activity Tolerance: Patient tolerated treatment well Patient left: with bed alarm set;with call bell/phone within reach;with family/visitor present     Time: [id number] PT Time Calculation (min) (ACUTE ONLY): 23 min  Charges:  $  Gait Training: 8-22 mins $Therapeutic Exercise: 8-22 mins                    G Codes:      Chesley Noon 09-17-2016, 10:36 AM

## 2016-09-11 NOTE — Progress Notes (Signed)
   Subjective: 2 Days Post-Op Procedure(s) (LRB): COMPUTER ASSISTED TOTAL KNEE ARTHROPLASTY (Right) Patient reports pain as mild.   Patient is well, and has had no acute complaints or problems Continue with physical  therapy today.  Plan is to go Home after hospital stay. no nausea and no vomiting Patient denies any chest pains or shortness of breath. Objective: Vital signs in last 24 hours: Temp:  [97.8 F (36.6 C)-98.7 F (37.1 C)] 97.8 F (36.6 C) (01/17 0352) Pulse Rate:  [55-60] 58 (01/17 0352) Resp:  [19] 19 (01/17 0352) BP: (102-131)/(52-71) 102/63 (01/17 0352) SpO2:  [95 %-97 %] 95 % (01/17 0352) well approximated incision Heels are non tender and elevated off the bed using rolled towels Intake/Output from previous day: 01/16 0701 - 01/17 0700 In: 720 [P.O.:720] Out: 920 [Urine:750; Drains:170] Intake/Output this shift: Total I/O In: 240 [P.O.:240] Out: 920 [Urine:750; Drains:170]   Recent Labs  09/10/16 0517 09/11/16 0618  HGB 11.3* 10.7*    Recent Labs  09/10/16 0517 09/11/16 0618  WBC 6.3 5.4  RBC 3.29* 3.18*  HCT 32.3* 31.5*  PLT 134* 126*    Recent Labs  09/10/16 0517  NA 135  K 4.1  CL 104  CO2 23  BUN 14  CREATININE 1.18  GLUCOSE 145*  CALCIUM 9.0   No results for input(s): LABPT, INR in the last 72 hours.  EXAM General - Patient is Alert, Appropriate and Oriented Extremity - Neurologically intact Neurovascular intact Sensation intact distally Intact pulses distally Dorsiflexion/Plantar flexion intact No cellulitis present Compartment soft Dressing - dressing C/D/I Motor Function - intact, moving foot and toes well on exam.    Past Medical History:  Diagnosis Date  . Actinic keratoses   . Cervicalgia   . Colon cancer ([place])    in remission  . GERD (gastroesophageal reflux disease)   . Hypertension   . Irritable bowel syndrome   . Plantar fibromatosis     Assessment/Plan: 2 Days Post-Op Procedure(s) (LRB): COMPUTER  ASSISTED TOTAL KNEE ARTHROPLASTY (Right) Active Problems:   S/P total knee arthroplasty  Estimated body mass index is 22.96 kg/m as calculated from the following:   Height as of this encounter: 5\' 5"  (1.651 m).   Weight as of this encounter: 62.6 kg (138 lb). Up with therapy Plan for discharge tomorrow Discharge home with home health  Labs: were reviewed DVT Prophylaxis - Lovenox, Foot Pumps and TED hose Weight-Bearing as tolerated to right leg Needs a bowel movement today Hemovac discontinued  Wille Glaser R. [place] 09/11/2016, 6:51 AM

## 2016-09-12 MED ORDER — ENOXAPARIN SODIUM 40 MG/0.4ML ~~LOC~~ SOLN: 40.0000 mg | SUBCUTANEOUS | 0 refills | Status: AC

## 2016-09-12 MED ORDER — TRAMADOL HCL 50 MG PO TABS: 50.0000 mg | ORAL_TABLET | ORAL | 0 refills | Status: AC | PRN

## 2016-09-12 MED ORDER — OXYCODONE HCL 5 MG PO TABS: 5.0000 mg | ORAL_TABLET | ORAL | 0 refills | Status: AC | PRN

## 2016-09-12 NOTE — Progress Notes (Signed)
Physical Therapy Treatment Patient Details Name: Kevin Rush MRN: [id number] DOB: [birth year] Today's Date: 09/12/2016    History of Present Illness Pt. is a 83 y.o. male who was admitted for a Right TKR    PT Comments    Pt in chair, ready to ambulate and return to bed.  He was able to stand and ambulate around nursing unit x 1.  To bathroom upon return where he had a medium BM.  Returned to bed. Anticipate D/C to [facility] today.  Follow Up Recommendations  SNF     Equipment Recommendations  Rolling walker with 5" wheels    Recommendations for Other Services       Precautions / Restrictions Precautions Precautions: Fall Restrictions Weight Bearing Restrictions: Yes RLE Weight Bearing: Weight bearing as tolerated    Mobility  Bed Mobility Overal bed mobility: Independent             General bed mobility comments: Pt able to rise to sitting EOB w/o assist  Transfers Overall transfer level: Modified independent Equipment used: Rolling walker (2 wheeled) Transfers: Sit to/from Stand Sit to Stand: Min guard            Ambulation/Gait Ambulation/Gait assistance: Min guard Ambulation Distance (Feet): 200 Feet Assistive device: Rolling walker (2 wheeled) Gait Pattern/deviations: Step-through pattern   Gait velocity interpretation: <1.8 ft/sec, indicative of risk for recurrent falls General Gait Details: improved gait this session, limited by pain   Stairs            Wheelchair Mobility    Modified Rankin (Stroke Patients Only)       Balance Overall balance assessment: Modified Independent                                  Cognition Arousal/Alertness: Awake/alert Behavior During Therapy: WFL for tasks assessed/performed Overall Cognitive Status: Within Functional Limits for tasks assessed                      Exercises Total Joint Exercises Ankle Circles/Pumps: AROM;10 reps Quad Sets: Strengthening;15 reps Gluteal  Sets: Strengthening;15 reps Short Arc Quad: Strengthening;15 reps Heel Slides: AROM;10 reps;Strengthening Hip ABduction/ADduction: Strengthening;10 reps Straight Leg Raises: AROM;10 reps Long Arc Quad: AROM;Right;10 reps;Seated Knee Flexion: AAROM;Right;5 reps;Seated Goniometric ROM: 0-91    General Comments        Pertinent Vitals/Pain Pain Assessment: 0-10 Pain Score: 6  Pain Location: R knee Pain Descriptors / Indicators: Sore;Operative site guarding Pain Intervention(s): Limited activity within patient's tolerance;Ice applied;RN gave pain meds during session    Home Living                      Prior Function            PT Goals (current goals can now be found in the care plan section) Progress towards PT goals: Progressing toward goals    Frequency    BID      PT Plan Current plan remains appropriate    Co-evaluation             End of Session Equipment Utilized During Treatment: Gait belt Activity Tolerance: Patient limited by pain Patient left: with call bell/phone within reach;in bed;with bed alarm set     Time: [id number] PT Time Calculation (min) (ACUTE ONLY): 20 min  Charges:  $Gait Training: 8-22 mins $Therapeutic Exercise: 8-22 mins  G Codes:      Chesley Noon 09/12/2016, 1:16 PM

## 2016-09-12 NOTE — Progress Notes (Signed)
Patient is medically stable for D/C to [facility] today. Per [facility] [employer] [facility] admissions coordinator at [facility] patient will go to room 349. RN will call report at [phone] and arrange EMS for transport. Clinical Education officer, museum (CSW) sent D/C orders to [employer] via [place]. Patient is aware of above. CSW contacted patient's wife Baldemar Wolfrom and made her aware of above. Please reconsult if future social work needs arise. CSW signing off.   [employer], LCSW [phone]

## 2016-09-12 NOTE — Progress Notes (Signed)
Alert and oriented. VSS during this shift. Up to [facility] during the night. Bone foam in place.

## 2016-09-12 NOTE — Progress Notes (Signed)
Physical Therapy Treatment Patient Details Name: Kevin Rush MRN: [id number] DOB: 07-20-1923 Today's Date: 09/30/2016    History of Present Illness Pt. is a 83 y.o. male who was admitted for a Right TKR    PT Comments    LATE ENTRY  Pt in chair ready to return to bed.  He was able to ambulate around nursing unit with min guard.  Gait quality remains decreased but no lob's noted.  Bed mobility without assist.     Follow Up Recommendations  Home health PT     Equipment Recommendations  Rolling walker with 5" wheels    Recommendations for Other Services       Precautions / Restrictions Precautions Precautions: Fall Restrictions Weight Bearing Restrictions: Yes RLE Weight Bearing: Weight bearing as tolerated    Mobility  Bed Mobility Overal bed mobility: Independent             General bed mobility comments: Pt able to rise to sitting EOB w/o assist  Transfers Overall transfer level: Modified independent Equipment used: Rolling walker (2 wheeled) Transfers: Sit to/from Stand Sit to Stand: Min guard            Ambulation/Gait Ambulation/Gait assistance: Min guard Ambulation Distance (Feet): 160 Feet Assistive device: Rolling walker (2 wheeled) Gait Pattern/deviations: Step-to pattern   Gait velocity interpretation: Below normal speed for age/gender General Gait Details: able to ambualte aroun nursing unit this pm   Stairs            Wheelchair Mobility    Modified Rankin (Stroke Patients Only)       Balance Overall balance assessment: Modified Independent                                  Cognition Arousal/Alertness: Awake/alert Behavior During Therapy: WFL for tasks assessed/performed Overall Cognitive Status: Within Functional Limits for tasks assessed                      Exercises      General Comments        Pertinent Vitals/Pain Pain Score: 4  Pain Location: R knee Pain Descriptors / Indicators:  Sore;Operative site guarding Pain Intervention(s): Limited activity within patient's tolerance;Ice applied;Repositioned    Home Living                      Prior Function            PT Goals (current goals can now be found in the care plan section) Progress towards PT goals: Progressing toward goals    Frequency    BID      PT Plan Current plan remains appropriate    Co-evaluation             End of Session Equipment Utilized During Treatment: Gait belt Activity Tolerance: Patient tolerated treatment well Patient left: with bed alarm set;with call bell/phone within reach;with family/visitor present     Time: 1120-1136 PT Time Calculation (min) (ACUTE ONLY): 16 min  Charges:  $Gait Training: 8-22 mins                    G Codes:      Chesley Noon September 30, 2016, 8:33 AM

## 2016-09-12 NOTE — Progress Notes (Signed)
Physical Therapy Treatment Patient Details Name: Kevin Rush MRN: [id number] DOB: [birth year] Today's Date: 09/12/2016    History of Present Illness Pt. is a 83 y.o. male who was admitted for a Right TKR    PT Comments    Pt in bed, reported general increase in pain this am.  Participated in exercises as described below.  Bed mobility remains independent.  He was able to ambulate to/from rehab gym with walker and min guard.  General decrease in gait speed this am due to pain but no lob's noted.  0-91 AAROM. Will continue with current POC.  Anticipate discharge to SNF for rehab.     Follow Up Recommendations  SNF     Equipment Recommendations  Rolling walker with 5" wheels    Recommendations for Other Services       Precautions / Restrictions Precautions Precautions: Fall Restrictions Weight Bearing Restrictions: Yes RLE Weight Bearing: Weight bearing as tolerated    Mobility  Bed Mobility Overal bed mobility: Independent             General bed mobility comments: Pt able to rise to sitting EOB w/o assist  Transfers Overall transfer level: Modified independent Equipment used: Rolling walker (2 wheeled) Transfers: Sit to/from Stand Sit to Stand: Min guard            Ambulation/Gait Ambulation/Gait assistance: Min guard Ambulation Distance (Feet): 80 Feet Assistive device: Rolling walker (2 wheeled) Gait Pattern/deviations: Step-through pattern   Gait velocity interpretation: Below normal speed for age/gender General Gait Details: ambulation limited due to pain this am.  general increase in soreness today.   Stairs            Wheelchair Mobility    Modified Rankin (Stroke Patients Only)       Balance Overall balance assessment: Modified Independent                                  Cognition Arousal/Alertness: Awake/alert Behavior During Therapy: WFL for tasks assessed/performed Overall Cognitive Status: Within Functional  Limits for tasks assessed                      Exercises Total Joint Exercises Ankle Circles/Pumps: AROM;10 reps Quad Sets: Strengthening;15 reps Gluteal Sets: Strengthening;15 reps Short Arc Quad: Strengthening;15 reps Heel Slides: AROM;10 reps;Strengthening Hip ABduction/ADduction: Strengthening;10 reps Straight Leg Raises: AROM;10 reps Long Arc Quad: AROM;Right;10 reps;Seated Knee Flexion: AAROM;Right;5 reps;Seated Goniometric ROM: 0-91    General Comments        Pertinent Vitals/Pain Pain Score: 8  Pain Location: R knee Pain Descriptors / Indicators: Sore;Operative site guarding Pain Intervention(s): Limited activity within patient's tolerance;Ice applied;Premedicated before session    Home Living                      Prior Function            PT Goals (current goals can now be found in the care plan section) Progress towards PT goals: Progressing toward goals    Frequency    BID      PT Plan Current plan remains appropriate    Co-evaluation             End of Session Equipment Utilized During Treatment: Gait belt Activity Tolerance: Patient limited by pain Patient left: in chair;with call bell/phone within reach;with chair alarm set     Time: [id number] PT Time  Calculation (min) (ACUTE ONLY): 30 min  Charges:  $Gait Training: 8-22 mins $Therapeutic Exercise: 8-22 mins                    G Codes:      Chesley Noon 10/05/2016, 10:14 AM

## 2016-09-12 NOTE — Progress Notes (Signed)
Patient was discharged to [facility] place via EMS. IV removed with cath intact. Belongings and packet sent with patient.

## 2016-09-12 NOTE — Clinical Social Work Placement (Signed)
   CLINICAL SOCIAL WORK PLACEMENT  NOTE  Date:  09/12/2016  Patient Details  Name: Kevin Rush MRN: [id number] Date of Birth: 13-Mar-1923  Clinical Social Work is seeking post-discharge placement for this patient at the [place] level of care (*CSW will initial, date and re-position this form in  chart as items are completed):  Yes   Patient/family provided with [place] Work Department's list of facilities offering this level of care within the geographic area requested by the patient (or if unable, by the patient's family).  Yes   Patient/family informed of their freedom to choose among providers that offer the needed level of care, that participate in Medicare, Medicaid or managed care program needed by the patient, have an available bed and are willing to accept the patient.  Yes   Patient/family informed of [facility]'s ownership interest in [facility] and [facility], as well as of the fact that they are under no obligation to receive care at these facilities.  PASRR submitted to EDS on 09/10/16     PASRR number received on 09/10/16     Existing PASRR number confirmed on       FL2 transmitted to all facilities in geographic area requested by pt/family on 09/10/16     FL2 transmitted to all facilities within larger geographic area on       Patient informed that his/her managed care company has contracts with or will negotiate with certain facilities, including the following:        Yes   Patient/family informed of bed offers received.  Patient chooses bed at  [facility])     Physician recommends and patient chooses bed at      Patient to be transferred to  [facility] ) on 09/12/16.  Patient to be transferred to facility by  [facility] )     Patient family notified on 09/12/16 of transfer.  Name of family member notified:   (Patient's wife [name] is aware of D/C today. )     PHYSICIAN        Additional Comment:    _______________________________________________ Natilie Krabbenhoft, Veronia Beets, LCSW 09/12/2016, 11:10 AM

## 2016-09-16 ENCOUNTER — Non-Acute Institutional Stay (SKILLED_NURSING_FACILITY): Payer: Medicare Other | Admitting: Gerontology

## 2016-09-16 DIAGNOSIS — R410 Disorientation, unspecified: Secondary | ICD-10-CM | POA: Diagnosis not present

## 2016-09-16 DIAGNOSIS — Z471 Aftercare following joint replacement surgery: Secondary | ICD-10-CM | POA: Diagnosis not present

## 2016-09-16 DIAGNOSIS — Z96651 Presence of right artificial knee joint: Secondary | ICD-10-CM | POA: Diagnosis not present

## 2016-09-17 ENCOUNTER — Non-Acute Institutional Stay (SKILLED_NURSING_FACILITY): Payer: Medicare Other | Admitting: Gerontology

## 2016-09-17 DIAGNOSIS — Z96651 Presence of right artificial knee joint: Secondary | ICD-10-CM | POA: Diagnosis not present

## 2016-09-17 DIAGNOSIS — R410 Disorientation, unspecified: Secondary | ICD-10-CM

## 2016-09-17 DIAGNOSIS — Z471 Aftercare following joint replacement surgery: Secondary | ICD-10-CM | POA: Diagnosis not present

## 2016-09-24 ENCOUNTER — Non-Acute Institutional Stay (SKILLED_NURSING_FACILITY): Payer: Medicare Other | Admitting: Gerontology

## 2016-09-24 DIAGNOSIS — Z471 Aftercare following joint replacement surgery: Secondary | ICD-10-CM | POA: Diagnosis not present

## 2016-09-24 DIAGNOSIS — Z96651 Presence of right artificial knee joint: Secondary | ICD-10-CM | POA: Diagnosis not present

## 2016-09-24 DIAGNOSIS — R410 Disorientation, unspecified: Secondary | ICD-10-CM

## 2016-09-26 ENCOUNTER — Encounter
Admission: RE | Admit: 2016-09-26 | Discharge: 2016-09-26 | Disposition: A | Discharge status: Home / Self Care | Payer: Medicare Other | Source: Ambulatory Visit | Attending: Internal Medicine | Admitting: Internal Medicine

## 2016-09-27 NOTE — Progress Notes (Signed)
Location:      Place of Service:  SNF (31) Provider:  Toni Arthurs, NP-C  SPARKS,JEFFREY D, MD  Patient Care Team: Idelle Crouch, MD as PCP - General (Internal Medicine)  Extended Emergency Contact Information Primary Emergency Contact: Kem Kays Address: [address] [place]          [place], [place] [address] [place] Phone: [phone] Relation: Spouse  Code Status:  full Goals of care: Advanced Directive information Advanced Directives 09/09/2016  Does Patient Have a Medical Advance Directive? Yes  Type of Paramedic of [place];Living will  Does patient want to make changes to medical advance directive? No - Patient declined  Copy of [place] in Chart? No - copy requested  Would patient like information on creating a medical advance directive? -     Chief Complaint  Patient presents with  . Acute Visit    HPI:  Pt is a 83 y.o. male seen today for an acute visit for delirium. Pt was admitted to the facility for rehab following a RTKA. Pt was in usual state of health and mentation earlier today as observed by me when walking down the hall and per staff and family report. This afternoon, pt began getting agitated and restless; persistently trying to get out of bed, disoriented, unable to answer simple questions or follow simple commands. Pt unable to verbalize daughter's name. Pt repeatedly removing covers and throwing legs over the side of the bed. Unable to be re-directed. Pt's family is very concerned he is going to get up and fall, causing harm to himself or his knee/ surgical site. After discussion with the wife and daughter, they are agreeable to administration of an antipsychotic to prevent injury/ harm. Unable to obtain accurate vitals at this time due to unable to follow directions to be still.    Past Medical History:  Diagnosis Date  . Actinic keratoses   . Cervicalgia   . Colon cancer  ([place])    in remission  . GERD (gastroesophageal reflux disease)   . Hypertension   . Irritable bowel syndrome   . Plantar fibromatosis    Past Surgical History:  Procedure Laterality Date  . APPENDECTOMY    . CARPAL TUNNEL RELEASE Bilateral   . CHOLECYSTECTOMY    . COLON RESECTION    . COLON SURGERY    . KNEE ARTHROPLASTY Right 09/09/2016   Procedure: COMPUTER ASSISTED TOTAL KNEE ARTHROPLASTY;  Surgeon: Dereck Leep, MD;  Location: [facility] ORS;  Service: Orthopedics;  Laterality: Right;  . TONSILLECTOMY      Allergies  Allergen Reactions  . Nortriptyline Shortness Of Breath and Other (See Comments)    ENERGY LOSS  . Zithromax [Azithromycin] Anaphylaxis    Allergies as of 09/16/2016      Reactions   Nortriptyline Shortness Of Breath, Other (See Comments)   ENERGY LOSS   Zithromax [azithromycin] Anaphylaxis      Medication List       Accurate as of 09/16/16 11:59 PM. Always use your most recent med list.          acetaminophen 500 MG tablet Commonly known as:  TYLENOL Take 500 mg by mouth every 6 (six) hours as needed (for pain/knee pain).   aspirin EC 325 MG tablet Take 325 mg by mouth daily.   cholestyramine light 4 GM/DOSE powder Commonly known as:  PREVALITE Take 4 g by mouth daily before breakfast.   enoxaparin 40 MG/0.4ML injection Commonly  known as:  LOVENOX Inject 0.4 mLs (40 mg total) into the skin daily.   feeding supplement (ENSURE ENLIVE) Liqd Take 237 mLs by mouth daily.   fluticasone 50 MCG/ACT nasal spray Commonly known as:  FLONASE Place 1 spray into both nostrils at bedtime.   loperamide 2 MG capsule Commonly known as:  IMODIUM Take 2-4 mg by mouth 4 (four) times daily as needed for diarrhea or loose stools.   metoprolol succinate 25 MG 24 hr tablet Commonly known as:  TOPROL-XL Take 12.5 mg by mouth daily.   MUCINEX NASAL SPRAY MOISTURE 0.05 % nasal spray Generic drug:  oxymetazoline Place 1 spray into both nostrils at bedtime.     multivitamin with minerals Tabs tablet Take 1 tablet by mouth daily.   omeprazole 20 MG capsule Commonly known as:  PRILOSEC Take 20 mg by mouth daily before breakfast.   oxyCODONE 5 MG immediate release tablet Commonly known as:  Oxy IR/ROXICODONE Take 1-2 tablets (5-10 mg total) by mouth every 4 (four) hours as needed for severe pain or breakthrough pain.   RA VITAMIN B-12 TR 1000 MCG Tbcr Generic drug:  Cyanocobalamin Take 1,000 mcg by mouth daily with lunch.   timolol 0.5 % ophthalmic solution Commonly known as:  TIMOPTIC Place 1 drop into both eyes 2 (two) times daily.   traMADol 50 MG tablet Commonly known as:  ULTRAM Take 1-2 tablets (50-100 mg total) by mouth every 4 (four) hours as needed for moderate pain.   traZODone 50 MG tablet Commonly known as:  DESYREL Take 50 mg by mouth at bedtime.       Review of Systems  Unable to perform ROS: Mental status change  HENT: Negative.   Eyes: Negative.   Skin: Positive for wound.  Psychiatric/Behavioral: Positive for agitation, behavioral problems and confusion.    Immunization History  Administered Date(s) Administered  . Pneumococcal Polysaccharide-23 09/27/2015   There are no preventive care reminders to display for this patient. No flowsheet data found. Functional Status Survey:    Vitals:   09/16/16 0600  BP: 123/71  Pulse: 67  Resp: 16  Temp: 98.1 F (36.7 C)  SpO2: 93%   There is no height or weight on file to calculate BMI. Physical Exam  Constitutional: Vital signs are normal. He appears well-developed and well-nourished. He is active and uncooperative. He does not appear ill. No distress.  HENT:  Head: Normocephalic and atraumatic.  Mouth/Throat: Uvula is midline, oropharynx is clear and moist and mucous membranes are normal. Mucous membranes are not pale, not dry and not cyanotic.  Eyes: Conjunctivae, EOM and lids are normal. Pupils are equal, round, and reactive to light.  Neck: Trachea  normal, normal range of motion and full passive range of motion without pain. Neck supple. No JVD present. No tracheal deviation, no edema and no erythema present. No thyromegaly present.  Cardiovascular: Normal rate, regular rhythm, normal heart sounds, intact distal pulses and normal pulses.  Exam reveals no gallop, no distant heart sounds and no friction rub.   No murmur heard. Pulmonary/Chest: Effort normal and breath sounds normal. No accessory muscle usage. No respiratory distress. He has no decreased breath sounds. He has no wheezes. He has no rhonchi. He has no rales. He exhibits no tenderness.  Abdominal: Normal appearance and bowel sounds are normal. He exhibits no distension and no ascites. There is no tenderness.  Musculoskeletal: He exhibits no edema or tenderness.       Right knee: He exhibits decreased  range of motion and laceration (incision).  Expected osteoarthritis, stiffness, calves soft, supple. Negative homan's sign. TEDs in place  Neurological: He is alert. He has normal strength. He is disoriented.  Skin: Skin is warm and dry. Laceration (Right knee incision) noted. He is not diaphoretic. No cyanosis. No pallor. Nails show no clubbing.  Psychiatric: Thought content normal. His affect is blunt. His speech is slurred. He is agitated. Cognition and memory are impaired. He expresses impulsivity and inappropriate judgment. He exhibits abnormal recent memory and abnormal remote memory.  Acute delirium  Nursing note and vitals reviewed.   Labs reviewed:  Recent Labs  08/28/16 1500 09/10/16 0517 09/11/16 0618  NA 132* 135 134*  K 3.9 4.1 4.1  CL 100* 104 105  CO2 27 23 26   GLUCOSE 102* 145* 99  BUN 13 14 19   CREATININE 1.28* 1.18 1.29*  CALCIUM 9.9 9.0 8.9    Recent Labs  08/28/16 1500  AST 23  ALT 18  ALKPHOS 89  BILITOT 0.8  PROT 7.1  ALBUMIN 3.9    Recent Labs  08/28/16 1500 09/10/16 0517 09/11/16 0618  WBC 3.9 6.3 5.4  HGB 13.2 11.3* 10.7*  HCT  38.3* 32.3* 31.5*  MCV 98.0 98.1 99.0  PLT 187 134* 126*   Lab Results  Component Value Date   TSH 1.27 01/04/2014   No results found for: HGBA1C Lab Results  Component Value Date   CHOL 101 01/04/2014   HDL 21 (L) 01/04/2014   LDLCALC 27 01/04/2014   TRIG 267 (H) 01/04/2014    Significant Diagnostic Results in last 30 days:  Dg Knee Right Port  Result Date: 09/09/2016 CLINICAL DATA:  Total knee replacement. EXAM: PORTABLE RIGHT KNEE - 1-2 VIEW COMPARISON:  None. FINDINGS: The components of the total knee prosthesis appear in excellent position. 2 soft tissue drains in place. IMPRESSION: Satisfactory appearance of the right knee after total knee replacement. Electronically Signed   By: Lorriane Shire M.D.   On: 09/09/2016 14:55    Assessment/Plan 1. Aftercare following right knee joint replacement surgery  Tramadol 50 mg 1 tablet po Q 4 hours prn- pain  DC Oxycodone  Continue complementary therapies including: PT/OT Restorative Nursing Ice pack to site QID and prn Diversional activities Repositioning Q2 hours and prn  Scheduled Tylenol 650 mg po QID  Lovenox 40 mg SQ Q day x 14 days for DVT prophylaxis  2. Delirium, acute  Haldol 2.5 mg IM x 1 now  Seroquel 25 mg po BID  Pain control  Family to stay with patient as much as possible  Family/ staff Communication:   Total Time:  Documentation:  Face to Face:  Family/Phone:   Labs/tests ordered:    Medication list reviewed and assessed for continued appropriateness.  Vikki Ports, NP-C Geriatrics [facility] [phone], [place] [address] Cell Phone (Mon-Fri 8am-5pm):  [phone] On Call:  [phone] & follow prompts after 5pm & weekends Office Phone:  [phone] Office Fax:  [phone]

## 2016-09-27 NOTE — Progress Notes (Signed)
Location:      Place of Service:  SNF (31) Provider:  Toni Arthurs, NP-C  SPARKS,JEFFREY D, MD  Patient Care Team: Idelle Crouch, MD as PCP - General (Internal Medicine)  Extended Emergency Contact Information Primary Emergency Contact: Kem Kays Address: [address] [place]          [place], [place] [address] [place] Phone: [phone] Relation: Spouse  Code Status:  full Goals of care: Advanced Directive information Advanced Directives 09/09/2016  Does Patient Have a Medical Advance Directive? Yes  Type of Paramedic of [place];Living will  Does patient want to make changes to medical advance directive? No - Patient declined  Copy of [place] in Chart? No - copy requested  Would patient like information on creating a medical advance directive? -     Chief Complaint  Patient presents with  . Follow-up    HPI:  Pt is a 83 y.o. male seen today for a follow up visit for delirium. Pt was admitted to the facility for rehab following a RTKA. Pt was in usual state of health and mentation earlier yesterday as observed by me when walking down the hall and per staff and family report. Later that afternoon, pt began getting agitated and restless; persistently trying to get out of bed, disoriented, unable to answer simple questions or follow simple commands. Pt unable to verbalize daughter's name. Pt repeatedly removing covers and throwing legs over the side of the bed. Unable to be re-directed. Pt's family is very concerned he is going to get up and fall, causing harm to himself or his knee/ surgical site. After discussion with the wife and daughter, they are agreeable to administration of an antipsychotic to prevent injury/ harm. This was effective. Pt was also started on PO Seroquel. Family endorses this is effective. Pt was able to participate in physical therapy and was currently taking a nap. He woke up when I  spoke his name. He answered questions appropriately and reported he was feeling well. No other complaints. VSS  Past Medical History:  Diagnosis Date  . Actinic keratoses   . Cervicalgia   . Colon cancer ([place])    in remission  . GERD (gastroesophageal reflux disease)   . Hypertension   . Irritable bowel syndrome   . Plantar fibromatosis    Past Surgical History:  Procedure Laterality Date  . APPENDECTOMY    . CARPAL TUNNEL RELEASE Bilateral   . CHOLECYSTECTOMY    . COLON RESECTION    . COLON SURGERY    . KNEE ARTHROPLASTY Right 09/09/2016   Procedure: COMPUTER ASSISTED TOTAL KNEE ARTHROPLASTY;  Surgeon: Dereck Leep, MD;  Location: [facility] ORS;  Service: Orthopedics;  Laterality: Right;  . TONSILLECTOMY      Allergies  Allergen Reactions  . Nortriptyline Shortness Of Breath and Other (See Comments)    ENERGY LOSS  . Zithromax [Azithromycin] Anaphylaxis    Allergies as of 09/17/2016      Reactions   Nortriptyline Shortness Of Breath, Other (See Comments)   ENERGY LOSS   Zithromax [azithromycin] Anaphylaxis      Medication List       Accurate as of 09/17/16 11:59 PM. Always use your most recent med list.          acetaminophen 500 MG tablet Commonly known as:  TYLENOL Take 500 mg by mouth every 6 (six) hours as needed (for pain/knee pain).   aspirin EC 325 MG tablet  Take 325 mg by mouth daily.   cholestyramine light 4 GM/DOSE powder Commonly known as:  PREVALITE Take 4 g by mouth daily before breakfast.   enoxaparin 40 MG/0.4ML injection Commonly known as:  LOVENOX Inject 0.4 mLs (40 mg total) into the skin daily.   feeding supplement (ENSURE ENLIVE) Liqd Take 237 mLs by mouth daily.   fluticasone 50 MCG/ACT nasal spray Commonly known as:  FLONASE Place 1 spray into both nostrils at bedtime.   loperamide 2 MG capsule Commonly known as:  IMODIUM Take 2-4 mg by mouth 4 (four) times daily as needed for diarrhea or loose stools.   metoprolol succinate 25  MG 24 hr tablet Commonly known as:  TOPROL-XL Take 12.5 mg by mouth daily.   MUCINEX NASAL SPRAY MOISTURE 0.05 % nasal spray Generic drug:  oxymetazoline Place 1 spray into both nostrils at bedtime.   multivitamin with minerals Tabs tablet Take 1 tablet by mouth daily.   omeprazole 20 MG capsule Commonly known as:  PRILOSEC Take 20 mg by mouth daily before breakfast.   oxyCODONE 5 MG immediate release tablet Commonly known as:  Oxy IR/ROXICODONE Take 1-2 tablets (5-10 mg total) by mouth every 4 (four) hours as needed for severe pain or breakthrough pain.   RA VITAMIN B-12 TR 1000 MCG Tbcr Generic drug:  Cyanocobalamin Take 1,000 mcg by mouth daily with lunch.   timolol 0.5 % ophthalmic solution Commonly known as:  TIMOPTIC Place 1 drop into both eyes 2 (two) times daily.   traMADol 50 MG tablet Commonly known as:  ULTRAM Take 1-2 tablets (50-100 mg total) by mouth every 4 (four) hours as needed for moderate pain.   traZODone 50 MG tablet Commonly known as:  DESYREL Take 50 mg by mouth at bedtime.       Review of Systems  Constitutional: Negative for appetite change, diaphoresis, fatigue, fever and unexpected weight change.  HENT: Negative.   Eyes: Negative.   Respiratory: Negative for cough, choking, chest tightness and shortness of breath.   Cardiovascular: Negative for chest pain.  Gastrointestinal: Negative for abdominal pain, constipation, diarrhea, nausea and vomiting.  Genitourinary: Negative.   Musculoskeletal: Positive for arthralgias.  Skin: Positive for wound.  Neurological: Negative for dizziness, tremors, facial asymmetry, speech difficulty, weakness, light-headedness, numbness and headaches.  Psychiatric/Behavioral: Negative for agitation, behavioral problems and confusion.    Immunization History  Administered Date(s) Administered  . Pneumococcal Polysaccharide-23 09/27/2015   There are no preventive care reminders to display for this patient. No  flowsheet data found. Functional Status Survey:    There were no vitals filed for this visit. There is no height or weight on file to calculate BMI. Physical Exam  Constitutional: He is oriented to person, place, and time. Vital signs are normal. He appears well-developed and well-nourished. He is active and cooperative. He does not appear ill. No distress.  HENT:  Head: Normocephalic and atraumatic.  Mouth/Throat: Uvula is midline, oropharynx is clear and moist and mucous membranes are normal. Mucous membranes are not pale, not dry and not cyanotic.  Eyes: Conjunctivae, EOM and lids are normal. Pupils are equal, round, and reactive to light.  Neck: Trachea normal, normal range of motion and full passive range of motion without pain. Neck supple. No JVD present. No tracheal deviation, no edema and no erythema present. No thyromegaly present.  Cardiovascular: Normal rate, regular rhythm, normal heart sounds, intact distal pulses and normal pulses.  Exam reveals no gallop, no distant heart sounds and no  friction rub.   No murmur heard. Pulmonary/Chest: Effort normal and breath sounds normal. No accessory muscle usage. No respiratory distress. He has no decreased breath sounds. He has no wheezes. He has no rhonchi. He has no rales. He exhibits no tenderness.  Abdominal: Normal appearance and bowel sounds are normal. He exhibits no distension and no ascites. There is no tenderness.  Musculoskeletal: He exhibits no edema or tenderness.       Right knee: He exhibits decreased range of motion and laceration (incision).  Expected osteoarthritis, stiffness, calves soft, supple. Negative homan's sign. TEDs in place  Neurological: He is alert and oriented to person, place, and time. He has normal strength. He is not disoriented.  Skin: Skin is warm and dry. Laceration (Right knee incision) noted. No rash noted. He is not diaphoretic. No cyanosis or erythema. No pallor. Nails show no clubbing.    Psychiatric: He has a normal mood and affect. His speech is normal and behavior is normal. Judgment and thought content normal. Cognition and memory are normal. Cognition and memory are not impaired. He does not express impulsivity or inappropriate judgment. He exhibits normal recent memory and normal remote memory.  Acute delirium improved  Nursing note and vitals reviewed.   Labs reviewed:  Recent Labs  08/28/16 1500 09/10/16 0517 09/11/16 0618  NA 132* 135 134*  K 3.9 4.1 4.1  CL 100* 104 105  CO2 27 23 26   GLUCOSE 102* 145* 99  BUN 13 14 19   CREATININE 1.28* 1.18 1.29*  CALCIUM 9.9 9.0 8.9    Recent Labs  08/28/16 1500  AST 23  ALT 18  ALKPHOS 89  BILITOT 0.8  PROT 7.1  ALBUMIN 3.9    Recent Labs  08/28/16 1500 09/10/16 0517 09/11/16 0618  WBC 3.9 6.3 5.4  HGB 13.2 11.3* 10.7*  HCT 38.3* 32.3* 31.5*  MCV 98.0 98.1 99.0  PLT 187 134* 126*   Lab Results  Component Value Date   TSH 1.27 01/04/2014   No results found for: HGBA1C Lab Results  Component Value Date   CHOL 101 01/04/2014   HDL 21 (L) 01/04/2014   LDLCALC 27 01/04/2014   TRIG 267 (H) 01/04/2014    Significant Diagnostic Results in last 30 days:  Dg Knee Right Port  Result Date: 09/09/2016 CLINICAL DATA:  Total knee replacement. EXAM: PORTABLE RIGHT KNEE - 1-2 VIEW COMPARISON:  None. FINDINGS: The components of the total knee prosthesis appear in excellent position. 2 soft tissue drains in place. IMPRESSION: Satisfactory appearance of the right knee after total knee replacement. Electronically Signed   By: Lorriane Shire M.D.   On: 09/09/2016 14:55    Assessment/Plan 1. Aftercare following right knee joint replacement surgery  Continue Tramadol 50 mg 1 tablet po Q 4 hours prn- pain Continue complementary therapies including: PT/OT Restorative Nursing Ice pack to site QID and prn Diversional activities Repositioning Q2 hours and prn  Scheduled Tylenol 650 mg po QID  Lovenox 40 mg  SQ Q day x 14 days for DVT prophylaxis  2. Delirium, acute  Continue Seroquel 25 mg po BID  Pain control  Family to stay with patient as much as possible  Family/ staff Communication:   Total Time:  Documentation:  Face to Face:  Family/Phone:   Labs/tests ordered:    Medication list reviewed and assessed for continued appropriateness.  Vikki Ports, NP-C Geriatrics [facility] [phone], [place] [address] Cell Phone (Mon-Fri  8am-5pm):  [phone] On Call:  [phone] & follow prompts after 5pm & weekends Office Phone:  [phone] Office Fax:  [phone]

## 2016-09-27 NOTE — Progress Notes (Signed)
Location:      Place of Service:  SNF (31) Provider:  Toni Arthurs, NP-C  SPARKS,JEFFREY D, MD  Patient Care Team: Idelle Crouch, MD as PCP - General (Internal Medicine)  Extended Emergency Contact Information Primary Emergency Contact: Kem Kays Address: [address] [place]          [place], [place] [address] [place] Phone: [phone] Relation: Spouse  Code Status:  full Goals of care: Advanced Directive information Advanced Directives 09/09/2016  Does Patient Have a Medical Advance Directive? Yes  Type of Paramedic of [place];Living will  Does patient want to make changes to medical advance directive? No - Patient declined  Copy of [place] in Chart? No - copy requested  Would patient like information on creating a medical advance directive? -     Chief Complaint  Patient presents with  . Discharge Note    HPI:  Pt is a 83 y.o. male seen today for a discharge evaluation for RTKA and delirium. Pt was admitted to the facility for rehab following a RTKA.During his stay, pt began getting agitated and restless; persistently trying to get out of bed, disoriented, unable to answer simple questions or follow simple commands. Pt unable to verbalize daughter's name. Pt repeatedly removing covers and throwing legs over the side of the bed. Unable to be re-directed. Pt's family is very concerned he is going to get up and fall, causing harm to himself or his knee/ surgical site. After discussion with the wife and daughter, they are agreeable to administration of an antipsychotic to prevent injury/ harm. This was effective. Pt was also started on PO Seroquel. Family endorses this is effective. Pt was able to participate in physical therapy. He answered questions appropriately and reported he was feeling well. Pt is smiling and pleasant. Wife and daughter at bedside. Pt reports he is ready to go home and is feeling  well. No other complaints. VSS. He does have bruising and edema to the upper right thigh. The area is tender. Family reports Ortho PA said it was [place] bruising which does appear to be accurate. However, family seemed nervous about it. I offered to have a doppler to evaluate the area.   Past Medical History:  Diagnosis Date  . Actinic keratoses   . Cervicalgia   . Colon cancer ([place])    in remission  . GERD (gastroesophageal reflux disease)   . Hypertension   . Irritable bowel syndrome   . Plantar fibromatosis    Past Surgical History:  Procedure Laterality Date  . APPENDECTOMY    . CARPAL TUNNEL RELEASE Bilateral   . CHOLECYSTECTOMY    . COLON RESECTION    . COLON SURGERY    . KNEE ARTHROPLASTY Right 09/09/2016   Procedure: COMPUTER ASSISTED TOTAL KNEE ARTHROPLASTY;  Surgeon: Dereck Leep, MD;  Location: [facility] ORS;  Service: Orthopedics;  Laterality: Right;  . TONSILLECTOMY      Allergies  Allergen Reactions  . Nortriptyline Shortness Of Breath and Other (See Comments)    ENERGY LOSS  . Zithromax [Azithromycin] Anaphylaxis    Allergies as of 09/24/2016      Reactions   Nortriptyline Shortness Of Breath, Other (See Comments)   ENERGY LOSS   Zithromax [azithromycin] Anaphylaxis      Medication List       Accurate as of 09/24/16 11:59 PM. Always use your most recent med list.  acetaminophen 500 MG tablet Commonly known as:  TYLENOL Take 500 mg by mouth every 6 (six) hours as needed (for pain/knee pain).   aspirin EC 325 MG tablet Take 325 mg by mouth daily.   cholestyramine light 4 GM/DOSE powder Commonly known as:  PREVALITE Take 4 g by mouth daily before breakfast.   enoxaparin 40 MG/0.4ML injection Commonly known as:  LOVENOX Inject 0.4 mLs (40 mg total) into the skin daily.   feeding supplement (ENSURE ENLIVE) Liqd Take 237 mLs by mouth daily.   fluticasone 50 MCG/ACT nasal spray Commonly known as:  FLONASE Place 1 spray into both nostrils  at bedtime.   loperamide 2 MG capsule Commonly known as:  IMODIUM Take 2-4 mg by mouth 4 (four) times daily as needed for diarrhea or loose stools.   metoprolol succinate 25 MG 24 hr tablet Commonly known as:  TOPROL-XL Take 12.5 mg by mouth daily.   MUCINEX NASAL SPRAY MOISTURE 0.05 % nasal spray Generic drug:  oxymetazoline Place 1 spray into both nostrils at bedtime.   multivitamin with minerals Tabs tablet Take 1 tablet by mouth daily.   omeprazole 20 MG capsule Commonly known as:  PRILOSEC Take 20 mg by mouth daily before breakfast.   oxyCODONE 5 MG immediate release tablet Commonly known as:  Oxy IR/ROXICODONE Take 1-2 tablets (5-10 mg total) by mouth every 4 (four) hours as needed for severe pain or breakthrough pain.   RA VITAMIN B-12 TR 1000 MCG Tbcr Generic drug:  Cyanocobalamin Take 1,000 mcg by mouth daily with lunch.   timolol 0.5 % ophthalmic solution Commonly known as:  TIMOPTIC Place 1 drop into both eyes 2 (two) times daily.   traMADol 50 MG tablet Commonly known as:  ULTRAM Take 1-2 tablets (50-100 mg total) by mouth every 4 (four) hours as needed for moderate pain.   traZODone 50 MG tablet Commonly known as:  DESYREL Take 50 mg by mouth at bedtime.       Review of Systems  Constitutional: Negative for appetite change, diaphoresis, fatigue, fever and unexpected weight change.  HENT: Negative.   Eyes: Negative.   Respiratory: Negative for cough, choking, chest tightness and shortness of breath.   Cardiovascular: Negative for chest pain.  Gastrointestinal: Negative for abdominal pain, constipation, diarrhea, nausea and vomiting.  Genitourinary: Negative.   Musculoskeletal: Positive for arthralgias.  Skin: Positive for wound.  Neurological: Negative for dizziness, tremors, facial asymmetry, speech difficulty, weakness, light-headedness, numbness and headaches.  Psychiatric/Behavioral: Negative for agitation, behavioral problems and confusion.     Immunization History  Administered Date(s) Administered  . Pneumococcal Polysaccharide-23 09/27/2015   There are no preventive care reminders to display for this patient. No flowsheet data found. Functional Status Survey:    Vitals:   09/24/16 0930  BP: 113/76  Pulse: (!) 102  Resp: 18  Temp: 98.2 F (36.8 C)  SpO2: 95%   There is no height or weight on file to calculate BMI. Physical Exam  Constitutional: He is oriented to person, place, and time. Vital signs are normal. He appears well-developed and well-nourished. He is active and cooperative. He does not appear ill. No distress.  HENT:  Head: Normocephalic and atraumatic.  Mouth/Throat: Uvula is midline, oropharynx is clear and moist and mucous membranes are normal. Mucous membranes are not pale, not dry and not cyanotic.  Eyes: Conjunctivae, EOM and lids are normal. Pupils are equal, round, and reactive to light.  Neck: Trachea normal, normal range of motion and full passive  range of motion without pain. Neck supple. No JVD present. No tracheal deviation, no edema and no erythema present. No thyromegaly present.  Cardiovascular: Normal rate, regular rhythm, normal heart sounds, intact distal pulses and normal pulses.  Exam reveals no gallop, no distant heart sounds and no friction rub.   No murmur heard. Pulmonary/Chest: Effort normal and breath sounds normal. No accessory muscle usage. No respiratory distress. He has no decreased breath sounds. He has no wheezes. He has no rhonchi. He has no rales. He exhibits no tenderness.  Abdominal: Normal appearance and bowel sounds are normal. He exhibits no distension and no ascites. There is no tenderness.  Musculoskeletal: He exhibits no edema or tenderness.       Right knee: He exhibits decreased range of motion and laceration (incision).  Expected osteoarthritis, stiffness, calves soft, supple. Negative homan's sign. TEDs in place  Neurological: He is alert and oriented to  person, place, and time. He has normal strength. He is not disoriented.  Skin: Skin is warm and dry. Laceration (Right knee incision) noted. No rash noted. He is not diaphoretic. No cyanosis or erythema. No pallor. Nails show no clubbing.  Psychiatric: He has a normal mood and affect. His speech is normal and behavior is normal. Judgment and thought content normal. Cognition and memory are normal. Cognition and memory are not impaired. He does not express impulsivity or inappropriate judgment. He exhibits normal recent memory and normal remote memory.  Acute delirium improved  Nursing note and vitals reviewed.   Labs reviewed:  Recent Labs  08/28/16 1500 09/10/16 0517 09/11/16 0618  NA 132* 135 134*  K 3.9 4.1 4.1  CL 100* 104 105  CO2 27 23 26   GLUCOSE 102* 145* 99  BUN 13 14 19   CREATININE 1.28* 1.18 1.29*  CALCIUM 9.9 9.0 8.9    Recent Labs  08/28/16 1500  AST 23  ALT 18  ALKPHOS 89  BILITOT 0.8  PROT 7.1  ALBUMIN 3.9    Recent Labs  08/28/16 1500 09/10/16 0517 09/11/16 0618  WBC 3.9 6.3 5.4  HGB 13.2 11.3* 10.7*  HCT 38.3* 32.3* 31.5*  MCV 98.0 98.1 99.0  PLT 187 134* 126*   Lab Results  Component Value Date   TSH 1.27 01/04/2014   No results found for: HGBA1C Lab Results  Component Value Date   CHOL 101 01/04/2014   HDL 21 (L) 01/04/2014   LDLCALC 27 01/04/2014   TRIG 267 (H) 01/04/2014    Significant Diagnostic Results in last 30 days:  Dg Knee Right Port  Result Date: 09/09/2016 CLINICAL DATA:  Total knee replacement. EXAM: PORTABLE RIGHT KNEE - 1-2 VIEW COMPARISON:  None. FINDINGS: The components of the total knee prosthesis appear in excellent position. 2 soft tissue drains in place. IMPRESSION: Satisfactory appearance of the right knee after total knee replacement. Electronically Signed   By: Lorriane Shire M.D.   On: 09/09/2016 14:55    Assessment/Plan 1. Aftercare following right knee joint replacement surgery  Continue Tramadol 50 mg 1  tablet po Q 4 hours prn- pain Continue complementary therapies including: PT/OT Restorative Nursing Ice pack to site QID and prn Diversional activities Repositioning Q2 hours and prn  Scheduled Tylenol 650 mg po QID  Lovenox 40 mg SQ Q day x 14 days for DVT prophylaxis  Follow up with PCP and Orthopedist asap for continuity of care  2. Delirium, acute  Resolved/ improved  Decrease to Seroquel 25 mg po Q HS  Mirtazepine 15  mg po Q HS- depression, sleep, appetite  Pain control  Family to stay with patient as much as possible  Family/ staff Communication:   Total Time:  Documentation:  Face to Face:  Family/Phone:  Patient is being discharged with the following home health services:  HHPT/OT through [facility]  Patient is being discharged with the following durable medical equipment:  none  Patient has been advised to f/u with their PCP in 1-2 weeks to bring them up to date on their rehab stay.  Social services at facility was responsible for arranging this appointment.  Pt was provided with a 30 day supply of prescriptions for medications and refills must be obtained from their PCP.  For controlled substances, a more limited supply may be provided adequate until PCP appointment only.  Labs/tests ordered:  Doppler of RLE (negative)  Medication list reviewed and assessed for continued appropriateness.  Vikki Ports, NP-C Geriatrics [facility] [phone], [place] [address] Cell Phone (Mon-Fri 8am-5pm):  [phone] On Call:  [phone] & follow prompts after 5pm & weekends Office Phone:  [phone] Office Fax:  [phone]

## 2016-10-02 ENCOUNTER — Other Ambulatory Visit: Payer: Self-pay | Admitting: Physician Assistant

## 2016-10-02 ENCOUNTER — Ambulatory Visit
Admission: RE | Admit: 2016-10-02 | Discharge: 2016-10-02 | Disposition: A | Discharge status: Home / Self Care | Payer: Medicare Other | Source: Ambulatory Visit | Attending: Physician Assistant | Admitting: Physician Assistant

## 2016-10-02 DIAGNOSIS — R0602 Shortness of breath: Secondary | ICD-10-CM | POA: Insufficient documentation

## 2016-10-02 DIAGNOSIS — Z96651 Presence of right artificial knee joint: Secondary | ICD-10-CM | POA: Diagnosis present

## 2016-10-02 DIAGNOSIS — I700 Atherosclerosis of aorta: Secondary | ICD-10-CM | POA: Insufficient documentation

## 2016-10-02 DIAGNOSIS — R918 Other nonspecific abnormal finding of lung field: Secondary | ICD-10-CM | POA: Diagnosis not present

## 2016-10-02 DIAGNOSIS — I2510 Atherosclerotic heart disease of native coronary artery without angina pectoris: Secondary | ICD-10-CM | POA: Insufficient documentation

## 2016-10-02 MED ORDER — IOPAMIDOL (ISOVUE-370) INJECTION 76%
75.0000 mL | Freq: Once | INTRAVENOUS | Status: AC | PRN
  Administered 2016-10-02: 75 mL via INTRAVENOUS

## 2017-04-15 IMAGING — CT CT ANGIO CHEST
2 of 6 series · 18 of 46 positions shown · IV contrast (APPLIED)
Comparison: CT chest of 09/26/2015

CLINICAL DATA: Recent knee surgery, now with episodes of shortness
of breath

EXAM:
CT ANGIOGRAPHY CHEST WITH CONTRAST
TECHNIQUE: Multidetector CT imaging of the chest was performed using the
standard protocol during bolus administration of intravenous
contrast. Multiplanar CT image reconstructions and MIPs were
obtained to evaluate the vascular anatomy.
CONTRAST:  75 cc Isovue 370

[Series 5: thins · axial · 0.66mm/px · z∈[-562,-316]mm · 16 of 270 slices shown]
[im 12/270  lung]
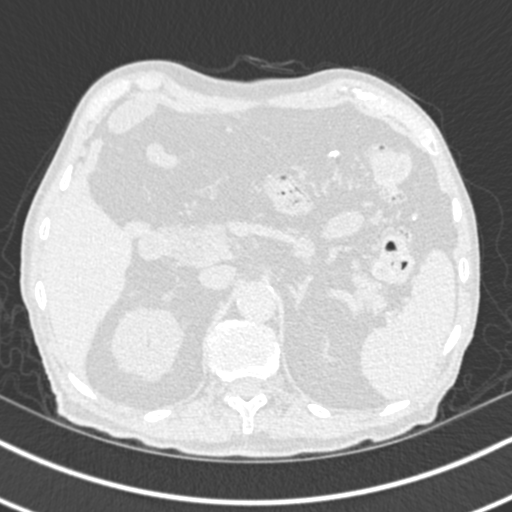
[im 36/270  soft-tissue]
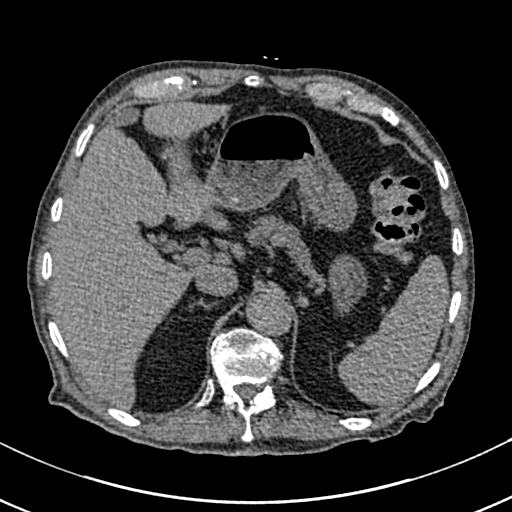
[im 47/270  lung]
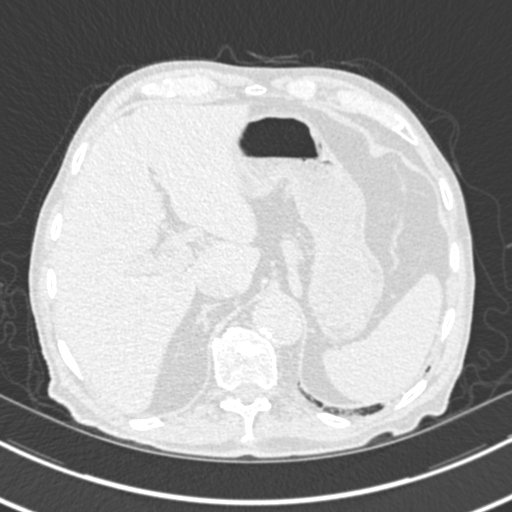
[im 59/270  soft-tissue]
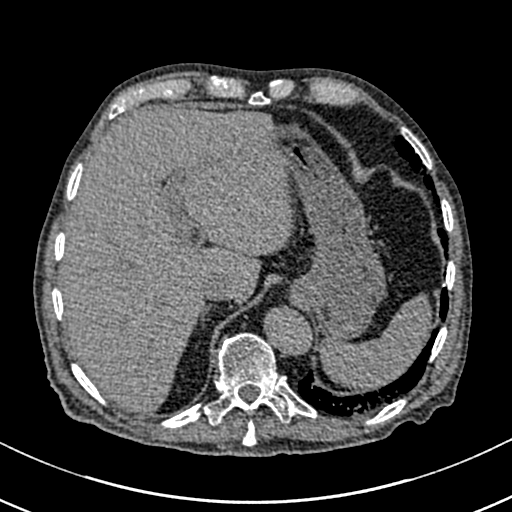
[im 82/270  lung]
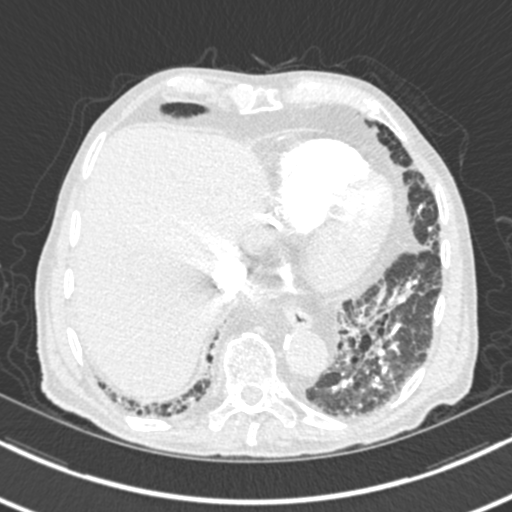
[im 94/270  soft-tissue]
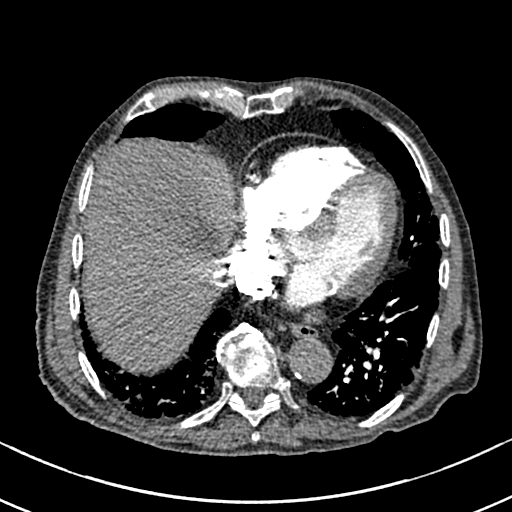
[im 106/270  lung]
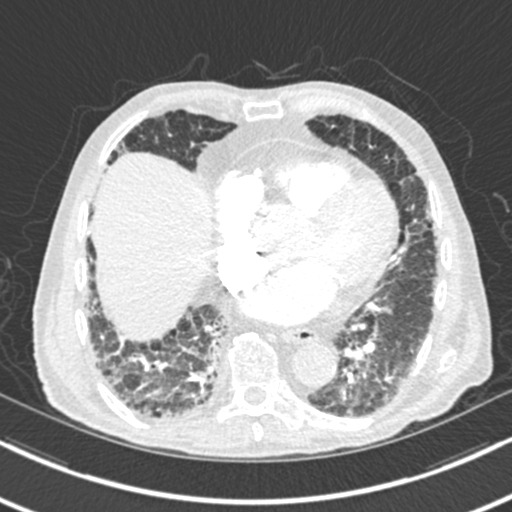
[im 129/270  soft-tissue]
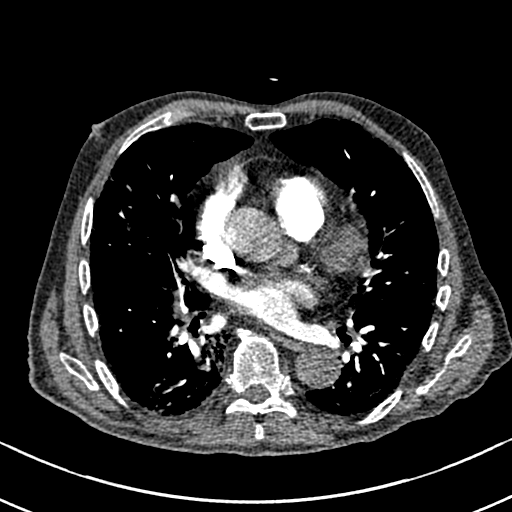
[im 141/270  lung]
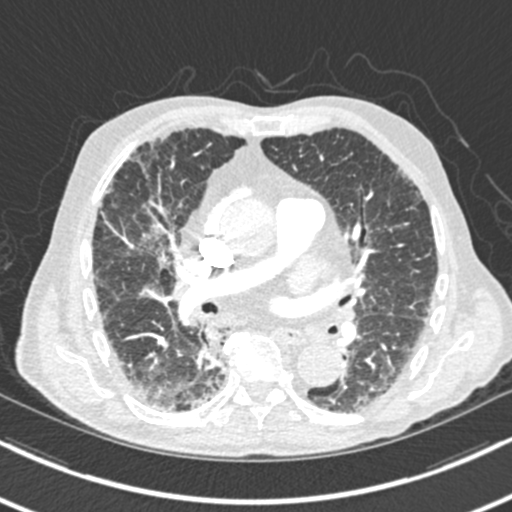
[im 164/270  soft-tissue]
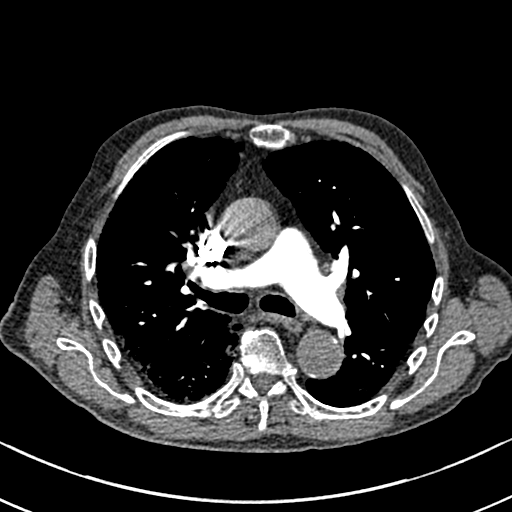
[im 176/270  lung]
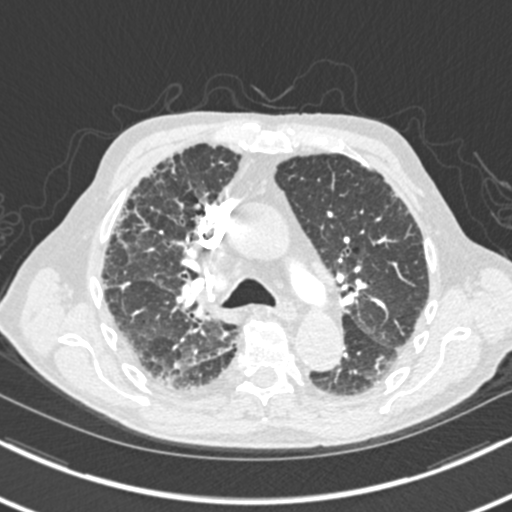
[im 188/270  soft-tissue]
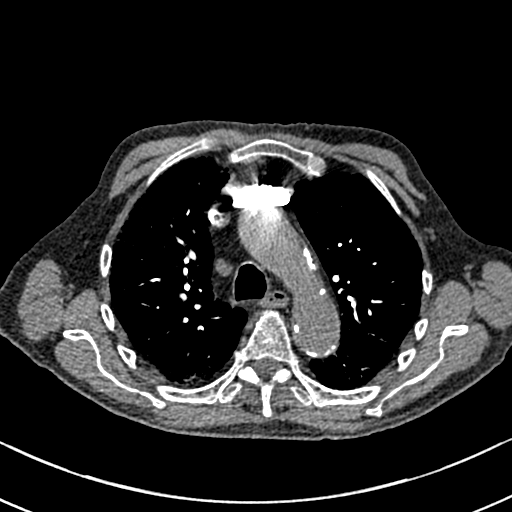
[im 211/270  lung]
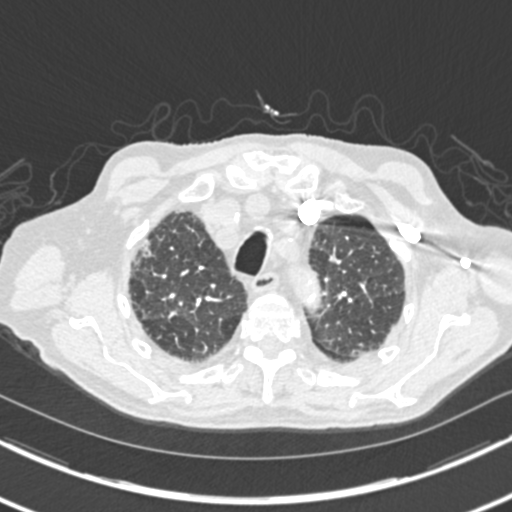
[im 223/270  soft-tissue]
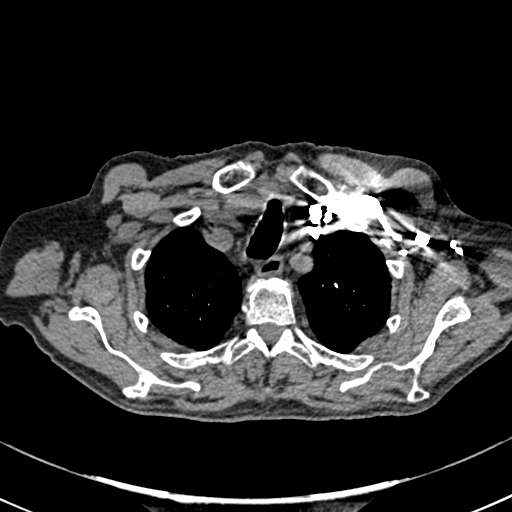
[im 234/270  lung]
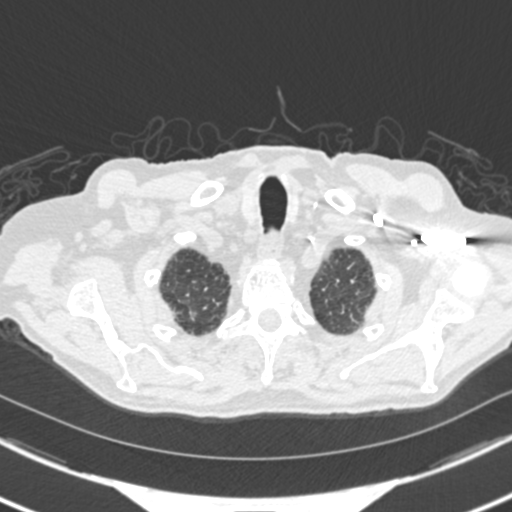
[im 258/270  soft-tissue]
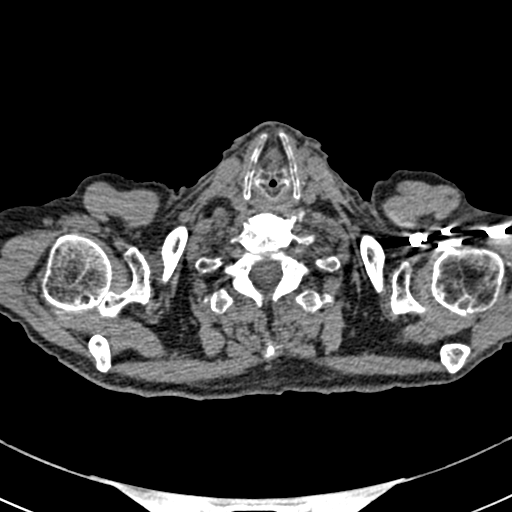

[Series 7: coronal mpr · coronal · 0.53mm/px · 2 of 93 slices shown]
[im 31/93  soft-tissue]
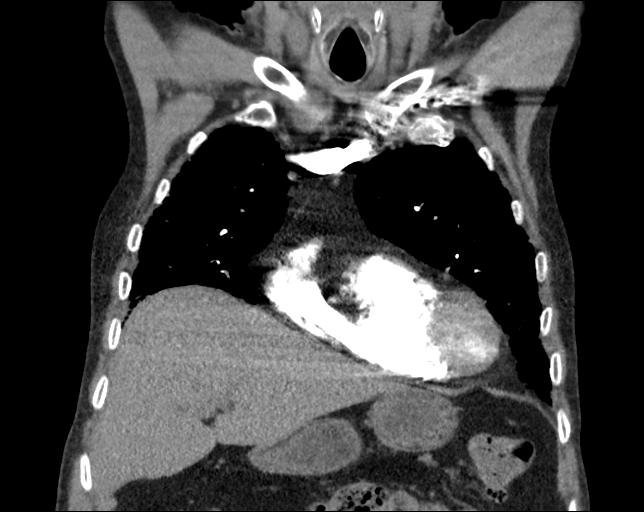
[im 62/93  soft-tissue]
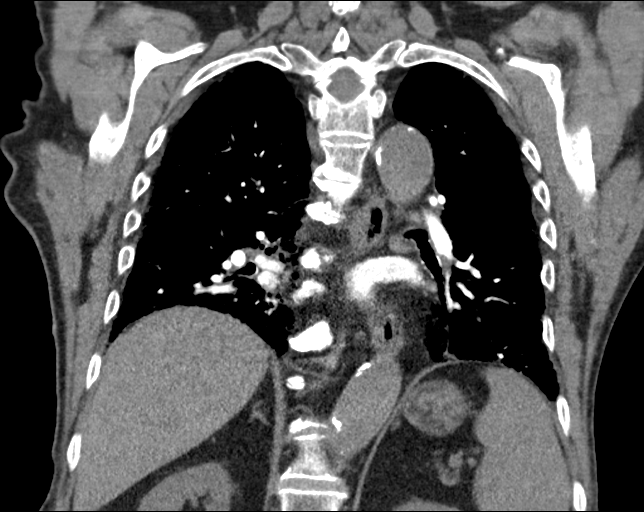

[18 of 46 positions shown; findings below may reference images not displayed]

FINDINGS: Cardiovascular: The pulmonary artery are well opacified. There is no
evidence of acute pulmonary embolism. The thoracic aorta is not well
opacified but no acute abnormality is seen. There are changes of
moderate thoracic aortic atherosclerosis present. Diffuse coronary
artery calcifications are noted. The heart is mildly enlarged. No
pericardial effusion is seen. The mid ascending thoracic aorta
measures 37 mm in diameter.

Mediastinum/Nodes: No mediastinal or hilar adenopathy is seen.
Mediastinal lymph nodes are stable compared to the prior CT.

Lungs/Pleura: Comparison with the prior lung window images is
slightly limited with less deep inspiration on the current study.
There does appear to have been some interval progression however of
the subpleural reticulation, ground-glass opacity and mild
architectural distortion. This probably represents fibrotic
nonspecific interstitial pneumonitis with usual interstitial
pneumonitis less likely.

Upper Abdomen: Images of the upper abdomen show no significant
abnormality. Surgical clips are present from prior cholecystectomy.

Musculoskeletal: The thoracic vertebrae are slightly kyphotic with
degenerative spurring noted in the mid to lower thoracic spine. No
compression deformity is seen. Degenerative disc disease is noted in
the region of C7-T1.

Review of the MIP images confirms the above findings.
IMPRESSION: 1. No evidence of acute pulmonary embolism.
2. Probable some progression of interstitial lung disease, possibly
fibrotic nonspecific interstitial pneumonitis.
3. Moderate thoracic aortic atherosclerosis.
4. Diffuse coronary artery calcifications.

## 2017-08-11 ENCOUNTER — Other Ambulatory Visit: Payer: Self-pay

## 2017-08-11 ENCOUNTER — Emergency Department
Admission: EM | Admit: 2017-08-11 | Discharge: 2017-08-12 | Disposition: A | Discharge status: Home / Self Care | Payer: Medicare Other | Attending: Emergency Medicine | Admitting: Emergency Medicine

## 2017-08-11 DIAGNOSIS — Z85038 Personal history of other malignant neoplasm of large intestine: Secondary | ICD-10-CM | POA: Diagnosis not present

## 2017-08-11 DIAGNOSIS — I959 Hypotension, unspecified: Secondary | ICD-10-CM | POA: Diagnosis present

## 2017-08-11 DIAGNOSIS — Z79899 Other long term (current) drug therapy: Secondary | ICD-10-CM | POA: Diagnosis not present

## 2017-08-11 DIAGNOSIS — Z8673 Personal history of transient ischemic attack (TIA), and cerebral infarction without residual deficits: Secondary | ICD-10-CM | POA: Insufficient documentation

## 2017-08-11 DIAGNOSIS — R531 Weakness: Secondary | ICD-10-CM | POA: Diagnosis not present

## 2017-08-11 DIAGNOSIS — I10 Essential (primary) hypertension: Secondary | ICD-10-CM | POA: Diagnosis not present

## 2017-08-11 LAB — URINALYSIS, COMPLETE (UACMP) WITH MICROSCOPIC
BILIRUBIN URINE: NEGATIVE
Bacteria, UA: NONE SEEN
Glucose, UA: NEGATIVE mg/dL
Hgb urine dipstick: NEGATIVE
Ketones, ur: NEGATIVE mg/dL
Nitrite: NEGATIVE
PH: 5 (ref 5.0–8.0)
Protein, ur: NEGATIVE mg/dL
SPECIFIC GRAVITY, URINE: 1.011 (ref 1.005–1.030)

## 2017-08-11 LAB — TROPONIN I: Troponin I: <0.03 ng/mL (ref <0.03)

## 2017-08-11 LAB — BASIC METABOLIC PANEL
Anion gap: 5 (ref 5–15)
BUN: 14 mg/dL (ref 6–20)
CO2: 22 mmol/L (ref 22–32)
Calcium: 9.3 mg/dL (ref 8.9–10.3)
Chloride: 106 mmol/L (ref 101–111)
Creatinine, Ser: 1.1 mg/dL (ref 0.61–1.24)
GFR, EST NON AFRICAN AMERICAN: 56 mL/min — AB (ref >60)
Glucose, Bld: 104 mg/dL — ABNORMAL HIGH (ref 65–99)
POTASSIUM: 3.8 mmol/L (ref 3.5–5.1)
SODIUM: 133 mmol/L — AB (ref 135–145)

## 2017-08-11 LAB — CBC
HCT: 34 % — ABNORMAL LOW (ref 40.0–52.0)
Hemoglobin: 11.7 g/dL — ABNORMAL LOW (ref 13.0–18.0)
MCH: 34.3 pg — AB (ref 26.0–34.0)
MCHC: 34.6 g/dL (ref 32.0–36.0)
MCV: 99.1 fL (ref 80.0–100.0)
PLATELETS: 134 10*3/uL — AB (ref 150–440)
RBC: 3.43 MIL/uL — AB (ref 4.40–5.90)
RDW: 13.3 % (ref 11.5–14.5)
WBC: 4.9 10*3/uL (ref 3.8–10.6)

## 2017-08-11 MED ORDER — SODIUM CHLORIDE 0.9 % IV BOLUS (SEPSIS)
1000.0000 mL | Freq: Once | INTRAVENOUS | Status: AC
  Administered 2017-08-11: 1000 mL via INTRAVENOUS

## 2017-08-11 NOTE — ED Provider Notes (Signed)
Patient is sitting up at the bedside, no distress.  Normal blood pressure in the last several blood pressure readings reports that he feels completely fine and would like to be discharged back home.  He is awake alert well oriented in no distress.  Able to ambulate well without difficulty, and his symptoms seem to be resolved.  Is been seen by the hospitalist service who recommended he be discharged, and the patient is in agreement as it does not appear that there would be much of anything which hospitalization could contribute at this time.  He reports that he will return to his facility and he can have his nurse check on there.  I am comfortable this plan for discharge as well as the hospitalist Dr. Jannifer Franklin.  Patient wishes to be discharged home and is in no distress with no complaint at this time and normal hemodynamics.  Appears much improved.  He does tell me that he has been working with his doctors and they have stopped some of his medicines because his been having these episodes like this, and does not appear to have an acute etiology noted at this time.  Vitals:   08/11/17 2056 08/11/17 2134  BP: 106/63 127/60  Pulse: (!) 59 (!) 52  Resp: 18 18  Temp:    SpO2: 96% 100%    And we will assist the patient in obtaining taxi ride back to his home.   Delman Kitten, MD 08/11/17 [phone]

## 2017-08-11 NOTE — ED Provider Notes (Signed)
[facility] Emergency Department Provider Note  ____________________________________________  Time seen: Approximately 7:49 PM  I have reviewed the triage vital signs and the nursing notes.   HISTORY  Chief Complaint Hypotension    HPI Kevin Rush is a 83 y.o. male with a history of HTN presenting with hypotension.  The patient reports that 8 days ago, he developed profound generalized weakness and checked his blood pressure which was 80s over 40s.  He was told to drink some water and immediately felt much better, and the nurse at his living facility told him to stop taking his metoprolol.  He has not taken his metoprolol since then, and had some mild hypotension in the middle of the week but overall has been doing better.  However, today, he felt "like a wet dishrag" all day and checked his blood pressure which was again 80 over 40s.  He states that he has been eating and drinking normally, has not had any illness including fever chills, nausea vomiting or diarrhea, chest pain, palpitations, shortness of breath.  The paramedics found the patient's blood pressure to be in the 60s over 40s, and improved to the 90s over 50s after 500 cc of fluid in route.  Past Medical History:  Diagnosis Date  . Actinic keratoses   . Cervicalgia   . Colon cancer ([place])    in remission  . GERD (gastroesophageal reflux disease)   . Hypertension   . Irritable bowel syndrome   . Plantar fibromatosis     Patient Active Problem List   Diagnosis Date Noted  . 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 Irritable bowel syndrome 09/09/2016  . Hypertension 09/09/2016  . History of colon cancer 09/09/2016  . Actinic keratoses 09/09/2016  . S/P total knee arthroplasty 09/09/2016  . Pulmonary fibrosis ([place]) 01/11/2016  . Bronchitis 09/25/2015  . Left optic neuritis 06/14/2015  . B12 deficiency 02/21/2015  . Tremor 03/04/2014  . Vertigo 02/21/2014  . Renal insufficiency 02/21/2014  . TIA  (transient ischemic attack) 01/07/2014  . Squamous cell carcinoma of skin of face 06/24/2013    Past Surgical History:  Procedure Laterality Date  . APPENDECTOMY    . CARPAL TUNNEL RELEASE Bilateral   . CHOLECYSTECTOMY    . COLON RESECTION    . COLON SURGERY    . KNEE ARTHROPLASTY Right 09/09/2016   Procedure: COMPUTER ASSISTED TOTAL KNEE ARTHROPLASTY;  Surgeon: Dereck Leep, MD;  Location: [facility] ORS;  Service: Orthopedics;  Laterality: Right;  . TONSILLECTOMY      Current Outpatient Rx  . Order #: [id number] Class: Historical Med  . Order #: [id number] Class: Historical Med  . Order #: [id number] Class: Historical Med  . Order #: [id number] Class: Historical Med  . Order #: [id number] Class: No Print  . Order #: [id number] Class: No Print  . Order #: [id number] Class: Historical Med  . Order #: [id number] Class: Historical Med  . Order #: [id number] Class: Historical Med  . Order #: [id number] Class: Historical Med  . Order #: [id number] Class: Historical Med  . Order #: [id number] Class: Print  . Order #: [id number] Class: Historical Med  . Order #: [id number] Class: Historical Med  . Order #: [id number] Class: Print  . Order #: [id number] Class: Historical Med    Allergies Nortriptyline and Zithromax [azithromycin]  Family History  Problem Relation Age of Onset  . Colon cancer Mother     Social History Social History   Tobacco Use  . Smoking status: Never Smoker  . Smokeless tobacco: Never Used  Substance Use Topics  . Alcohol  use: Yes    Alcohol/week: 0.0 oz    Comment: drinks oz. canadian whiskey every night  . Drug use: No    Review of Systems Constitutional: No fever/chills.  No lightheadedness or syncope.  Positive generalized weakness. Eyes: No visual changes.  No blurred or double vision. ENT: No sore throat. No congestion or rhinorrhea. Cardiovascular: Denies chest pain. Denies palpitations.  Positive hypotension. Respiratory: Denies shortness of breath.  No  cough. Gastrointestinal: No abdominal pain.  No nausea, no vomiting.  No diarrhea.  No constipation. Genitourinary: Negative for dysuria. Musculoskeletal: Negative for back pain.  No lower extremity swelling or calf pain.  Skin: Negative for rash. Neurological: Negative for headaches. No focal numbness, tingling or weakness.     ____________________________________________   PHYSICAL EXAM:  VITAL SIGNS: ED Triage Vitals  Enc Vitals Group     BP 08/11/17 1910 92/60     Pulse Rate 08/11/17 1910 62     Resp 08/11/17 1910 17     Temp 08/11/17 1910 97.7 F (36.5 C)     Temp Source 08/11/17 1910 Oral     SpO2 08/11/17 1908 97 %     Weight 08/11/17 1911 132 lb (59.9 kg)     Height 08/11/17 1911 5\' 5"  (1.651 m)     Head Circumference --      Peak Flow --      Pain Score --      Pain Loc --      Pain Edu? --      Excl. in [place]? --     Constitutional: The patient is alert and oriented, answering questions appropriately and joking throughout my examination.  His GCS is 15. Eyes: Conjunctivae are normal.  EOMI. PERRLA.  No scleral icterus. Head: Atraumatic. Nose: No congestion/rhinnorhea. Mouth/Throat: Mucous membranes are dry.  Neck: No stridor.  Supple.  No JVD.  No meningismus. Cardiovascular: Normal rate, regular rhythm. No murmurs, rubs or gallops.  Respiratory: Normal respiratory effort.  No accessory muscle use or retractions. Lungs CTAB.  No wheezes, rales or ronchi. Gastrointestinal: Soft, nontender and nondistended.  No guarding or rebound.  No peritoneal signs. Musculoskeletal: No LE edema. No ttp in the calves or palpable cords.  Negative Homan's sign. Neurologic:  A&Ox3.  Speech is clear.  Face and smile are symmetric.  EOMI.  Moves all extremities well. Skin:  Skin is warm, dry and intact. No rash noted. Psychiatric: Mood and affect are normal. Speech and behavior are normal.  Normal judgement.  ____________________________________________   LABS (all labs ordered  are listed, but only abnormal results are displayed)  Labs Reviewed  CBC  BASIC METABOLIC PANEL  URINALYSIS, COMPLETE (UACMP) WITH MICROSCOPIC  TROPONIN I   ____________________________________________  EKG  ED ECG REPORT I, Eula Listen, the attending physician, personally viewed and interpreted this ECG.   Date: 08/11/2017  EKG Time: 1911  Rate: 64  Rhythm: normal sinus rhythm  Axis: leftward  Intervals:first-degree A-V block   ST&T Change: Specific T wave inversion in V1.  No STEMI.  ____________________________________________  RADIOLOGY  No results found.  ____________________________________________   PROCEDURES  Procedure(s) performed: None  Procedures  Critical Care performed: Yes ____________________________________________   INITIAL IMPRESSION / ASSESSMENT AND PLAN / ED COURSE  Pertinent labs & imaging results that were available during my care of the patient were reviewed by me and considered in my medical decision making (see chart for details).  83 y.o. male, presenting with generalized weakness and associated hypotension without any  other symptoms.  Overall, the patient at rest after intravenous fluid continues to still be hypotensive with a blood pressure of 90 over 60s.  Given that he has been off of all his antihypertensives for over a week, it is difficult to blame this on a pharmacologic cause and will need to do further investigation.  The patient has a heart rate in the 60s, and he may have some hypotension due to low heart rate.  We will also check his troponin, and look for any signs of infection including UTI.  He has no signs or symptoms that would be consistent with influenza or pneumonia today.  Plan admission to the hospital for further evaluation and treatment.   I have reviewed the patient's medical chart  ----------------------------------------- 8:42 PM on 08/11/2017 -----------------------------------------  Patient  continues to [facility] normally.  His blood pressure has remained in the 90s since arrival and he is receiving another liter of fluid at this time.  His blood counts do not show any evidence of anemia.  The patient has been admitted to the hospital for further evaluation and treatment.  CRITICAL CARE Performed by: Eula Listen   Total critical care time: 35 minutes  Critical care time was exclusive of separately billable procedures and treating other patients.  Critical care was necessary to treat or prevent imminent or life-threatening deterioration.  Critical care was time spent personally by me on the following activities: development of treatment plan with patient and/or surrogate as well as nursing, discussions with consultants, evaluation of patient's response to treatment, examination of patient, obtaining history from patient or surrogate, ordering and performing treatments and interventions, ordering and review of laboratory studies, ordering and review of radiographic studies, pulse oximetry and re-evaluation of patient's condition.   ____________________________________________  FINAL CLINICAL IMPRESSION(S) / ED DIAGNOSES  Final diagnoses:  None         NEW MEDICATIONS STARTED DURING THIS VISIT:  This SmartLink is deprecated. Use AVSMEDLIST instead to display the medication list for a patient.    Eula Listen, MD 08/11/17 2042

## 2017-08-11 NOTE — ED Notes (Signed)
Clemon Chambers, daughter, (cell) [phone] or (home) [phone]

## 2017-08-11 NOTE — ED Triage Notes (Addendum)
Pt arrives to ED via [facility] from [facility] at [facility] with c/o HYPOtension. Per EMS, pt has not taking his Metoprolol x2 weeks. Facility RN noted pt's BP to be 54/38 and called EMS. EMS reports BP upon their arrival was 64/40; BP improved to 96/62 with addition of approximately 271mL of 0.9% NS PIV. Pt is A&O, in NAD; RR even, regular, and unlabored; no c/o pain.

## 2017-08-11 NOTE — H&P (Addendum)
[place] at [facility] - ED consult   PATIENT NAME: Kevin Rush    MR#:  [id number]  DATE OF BIRTH:  30-Aug-1922  DATE OF ADMISSION:  08/11/2017  PRIMARY CARE PHYSICIAN: Idelle Crouch, MD   REQUESTING/REFERRING PHYSICIAN: Mariea Clonts, MD  CHIEF COMPLAINT:   Chief Complaint  Patient presents with  . Hypotension    HISTORY OF PRESENT ILLNESS:  Kevin Rush  is a 83 y.o. male who presents with low blood pressure.  Patient states that he has been having episodes over the past couple of weeks, 2-3 episodes, where he feels acutely weak.  He checks his blood pressure at the time and it will typically be low, he quotes numbers similar to 80s over 40s.  Usually when this is happened he will drink some fluids or be given some fluids at the facility where he is and his blood pressure will correct and he will feel better.  He had an episode today and when EMS got there to check him out his blood pressure was in the 03J systolic.  They brought him to the ED for evaluation.  He received something between 250 and 500 mL of normal saline in route and his blood pressure improved to 00X systolic.  Here he received 2 more liters IV and his blood pressure corrected now to around 120s over 80s, and the patient states that he feels much better, back to his baseline.  Laboratory workup is all within normal limits.  He has no symptoms of infection, and UA is not suspicious for any infection.  Hospitalist were called for evaluation of his recurring low blood pressure.  PAST MEDICAL HISTORY:   Past Medical History:  Diagnosis Date  . Actinic keratoses   . Cervicalgia   . Colon cancer ([place])    in remission  . GERD (gastroesophageal reflux disease)   . Hypertension   . Irritable bowel syndrome   . Plantar fibromatosis     PAST SURGICAL HISTOIRY:   Past Surgical History:  Procedure Laterality Date  . APPENDECTOMY    . CARPAL TUNNEL RELEASE Bilateral   . CHOLECYSTECTOMY     . COLON RESECTION    . COLON SURGERY    . KNEE ARTHROPLASTY Right 09/09/2016   Procedure: COMPUTER ASSISTED TOTAL KNEE ARTHROPLASTY;  Surgeon: Dereck Leep, MD;  Location: [facility] ORS;  Service: Orthopedics;  Laterality: Right;  . TONSILLECTOMY      SOCIAL HISTORY:   Social History   Tobacco Use  . Smoking status: Never Smoker  . Smokeless tobacco: Never Used  Substance Use Topics  . Alcohol use: Yes    Alcohol/week: 0.0 oz    Comment: drinks oz. canadian whiskey every night    FAMILY HISTORY:   Family History  Problem Relation Age of Onset  . Colon cancer Mother     DRUG ALLERGIES:   Allergies  Allergen Reactions  . Nortriptyline Shortness Of Breath and Other (See Comments)    ENERGY LOSS  . Zithromax [Azithromycin] Anaphylaxis    REVIEW OF SYSTEMS:  Review of Systems  Constitutional: Positive for malaise/fatigue. Negative for chills, fever and weight loss.  HENT: Negative for ear pain, hearing loss and tinnitus.   Eyes: Negative for blurred vision, double vision, pain and redness.  Respiratory: Negative for cough, hemoptysis and shortness of breath.   Cardiovascular: Negative for chest pain, palpitations, orthopnea and leg swelling.  Gastrointestinal: Negative for abdominal pain, constipation, diarrhea, nausea and vomiting.  Genitourinary: Negative  for dysuria, frequency and hematuria.  Musculoskeletal: Negative for back pain, joint pain and neck pain.  Skin:       No acne, rash, or lesions  Neurological: Negative for dizziness, tremors, focal weakness and weakness.  Endo/Heme/Allergies: Negative for polydipsia. Does not bruise/bleed easily.  Psychiatric/Behavioral: Negative for depression. The patient is not nervous/anxious and does not have insomnia.     MEDICATIONS AT HOME:   Prior to Admission medications   Medication Sig Start Date End Date Taking? Authorizing Provider  aspirin EC 325 MG tablet Take 325 mg by mouth daily.   Yes [provider]   Cyanocobalamin (RA VITAMIN B-12 TR) 1000 MCG TBCR Take 1,000 mcg by mouth daily with lunch.   Yes [provider]  feeding supplement, ENSURE ENLIVE, (ENSURE ENLIVE) LIQD Take 237 mLs by mouth daily. 09/27/15  Yes Dustin Flock, MD  fluticasone (FLONASE) 50 MCG/ACT nasal spray Place 1 spray into both nostrils at bedtime. 07/24/16  Yes [provider]  gabapentin (NEURONTIN) 100 MG capsule Take 1 capsule by mouth at bedtime. 07/22/17  Yes [provider]  loperamide (IMODIUM) 2 MG capsule Take 2-4 mg by mouth 4 (four) times daily as needed for diarrhea or loose stools.   Yes [provider]  metoprolol succinate (TOPROL-XL) 25 MG 24 hr tablet Take 12.5 mg by mouth daily.   Yes [provider]  Multiple Vitamin (MULTIVITAMIN WITH MINERALS) TABS tablet Take 1 tablet by mouth daily.   Yes [provider]  omeprazole (PRILOSEC) 20 MG capsule Take 20 mg by mouth daily before breakfast.    Yes [provider]  oxymetazoline (MUCINEX NASAL SPRAY MOISTURE) 0.05 % nasal spray Place 1 spray into both nostrils at bedtime.   Yes [provider]  timolol (TIMOPTIC) 0.5 % ophthalmic solution Place 1 drop into both eyes 2 (two) times daily.   Yes [provider]  acetaminophen (TYLENOL) 500 MG tablet Take 500 mg by mouth every 6 (six) hours as needed (for pain/knee pain).     [provider]  azelastine (ASTELIN) 0.1 % nasal spray Place 1 spray into both nostrils 2 (two) times daily. 07/02/17   [provider]  cholestyramine light (PREVALITE) 4 GM/DOSE powder Take 4 g by mouth daily before breakfast. 07/24/16   [provider]  enoxaparin (LOVENOX) 40 MG/0.4ML injection Inject 0.4 mLs (40 mg total) into the skin daily. Patient not taking: Reported on 08/11/2017 09/12/16   Hooten, Laurice Record, MD  oxyCODONE (OXY IR/ROXICODONE) 5 MG immediate release tablet Take 1-2 tablets (5-10 mg total) by mouth every 4 (four)  hours as needed for severe pain or breakthrough pain. Patient not taking: Reported on 08/11/2017 09/12/16   Hooten, Laurice Record, MD  traMADol (ULTRAM) 50 MG tablet Take 1-2 tablets (50-100 mg total) by mouth every 4 (four) hours as needed for moderate pain. Patient not taking: Reported on 08/11/2017 09/12/16   Dereck Leep, MD      VITAL SIGNS:   Vitals:   08/11/17 1911 08/11/17 2019 08/11/17 2056 08/11/17 2134  BP:  90/66 106/63 127/60  Pulse:  (!) 59 (!) 59 (!) 52  Resp:  18 18 18   Temp:      TempSrc:      SpO2:  95% 96% 100%  Weight: 59.9 kg (132 lb)     Height: 5\' 5"  (1.651 m)      Wt Readings from Last 3 Encounters:  08/11/17 59.9 kg (132 lb)  09/09/16 62.6 kg (  138 lb)  08/28/16 62.6 kg (138 lb)    PHYSICAL EXAMINATION:  Physical Exam  Vitals reviewed. Constitutional: He is oriented to person, place, and time. He appears well-developed and well-nourished. No distress.  HENT:  Head: Normocephalic and atraumatic.  Mouth/Throat: Oropharynx is clear and moist.  Eyes: Conjunctivae and EOM are normal. Pupils are equal, round, and reactive to light. No scleral icterus.  Neck: Normal range of motion. Neck supple. No JVD present. No thyromegaly present.  Cardiovascular: Normal rate, regular rhythm and intact distal pulses. Exam reveals no gallop and no friction rub.  No murmur heard. Respiratory: Effort normal and breath sounds normal. No respiratory distress. He has no wheezes. He has no rales.  GI: Soft. Bowel sounds are normal. He exhibits no distension. There is no tenderness.  Musculoskeletal: Normal range of motion. He exhibits no edema.  No arthritis, no gout  Lymphadenopathy:    He has no cervical adenopathy.  Neurological: He is alert and oriented to person, place, and time. No cranial nerve deficit.  No dysarthria, no aphasia  Skin: Skin is warm and dry. No rash noted. No erythema.  Psychiatric: He has a normal mood and affect. His behavior is normal. Judgment and  thought content normal.     LABORATORY PANEL:   CBC Recent Labs  Lab 08/11/17 2011  WBC 4.9  HGB 11.7*  HCT 34.0*  PLT 134*   ------------------------------------------------------------------------------------------------------------------  Chemistries  Recent Labs  Lab 08/11/17 2011  NA 133*  K 3.8  CL 106  CO2 22  GLUCOSE 104*  BUN 14  CREATININE 1.10  CALCIUM 9.3   ------------------------------------------------------------------------------------------------------------------  Cardiac Enzymes Recent Labs  Lab 08/11/17 2011  TROPONINI <0.03   ------------------------------------------------------------------------------------------------------------------  RADIOLOGY:  No results found.  EKG:   Orders placed or performed during the hospital encounter of 08/11/17  . EKG 12-Lead  . EKG 12-Lead    IMPRESSION AND PLAN:  Principal Problem:   Hypotension -patient states he stopped taking his metoprolol about a week ago given these episodes of low blood pressure.  He is not on any other medications at this time that should lower his blood pressure significantly.  Given his return to normal blood pressure and complete resolution of his symptoms with normal laboratory workup here in the ED, there is little we can offer him in the inpatient setting for this.  I discussed with him following up with his primary care physician for any further possible medication adjustments or further workup that might be warranted.  Patient is agreeable to this plan.  All the records are reviewed and case discussed with ED provider. Management plans discussed with the patient and/or family.  TOTAL TIME TAKING CARE OF THIS PATIENT: 40 minutes.    Carsin Randazzo [place] 08/11/2017, 10:40 PM  [employer]  [phone]  CC: Primary care Physician: Idelle Crouch, MD  Note:  This document was prepared using Dragon voice recognition software and may  include unintentional dictation errors.

## 2017-08-11 NOTE — Discharge Instructions (Signed)
Follow-up closely with Dr. Doy Hutching  Please call your regular doctor as soon as possible to schedule the next available clinic appointment to follow up with him/her regarding your visit to the ED and your symptoms.  Return to the Emergency Department (ED)  if you have any recurrence of your weakness episodes, have low blood pressures again, or develop ANY chest pain, pressure, tightness, trouble breathing, sudden sweating, or other symptoms that concern you.

## 2017-08-13 LAB — URINE CULTURE: SPECIAL REQUESTS: NORMAL

## 2019-07-14 ENCOUNTER — Encounter
Admission: RE | Admit: 2019-07-14 | Discharge: 2019-07-14 | Disposition: A | Discharge status: Home / Self Care | Payer: Medicare Other | Source: Ambulatory Visit | Attending: Internal Medicine | Admitting: Internal Medicine

## 2019-10-14 ENCOUNTER — Encounter
Admission: RE | Admit: 2019-10-14 | Discharge: 2019-10-14 | Disposition: A | Discharge status: Home / Self Care | Payer: Medicare Other | Source: Ambulatory Visit | Attending: Internal Medicine | Admitting: Internal Medicine

## 2019-11-29 ENCOUNTER — Ambulatory Visit: Payer: Medicare Other | Admitting: Physical Therapy

## 2019-12-02 ENCOUNTER — Encounter: Payer: Medicare Other | Admitting: Physical Therapy

## 2019-12-06 ENCOUNTER — Encounter: Payer: Medicare Other | Admitting: Physical Therapy

## 2019-12-08 ENCOUNTER — Encounter: Payer: Medicare Other | Admitting: Physical Therapy

## 2019-12-13 ENCOUNTER — Encounter: Payer: Medicare Other | Admitting: Physical Therapy

## 2019-12-15 ENCOUNTER — Encounter: Payer: Medicare Other | Admitting: Physical Therapy

## 2019-12-20 ENCOUNTER — Encounter: Payer: Medicare Other | Admitting: Physical Therapy

## 2019-12-22 ENCOUNTER — Encounter: Payer: Medicare Other | Admitting: Physical Therapy

## 2019-12-27 ENCOUNTER — Encounter
Admission: RE | Admit: 2019-12-27 | Discharge: 2019-12-27 | Disposition: A | Discharge status: Home / Self Care | Payer: Medicare Other | Source: Ambulatory Visit | Attending: Internal Medicine | Admitting: Internal Medicine

## 2020-03-13 ENCOUNTER — Encounter
Admission: RE | Admit: 2020-03-13 | Discharge: 2020-03-13 | Disposition: A | Discharge status: Home / Self Care | Payer: Medicare Other | Source: Ambulatory Visit | Attending: Internal Medicine | Admitting: Internal Medicine

## 2020-04-25 ENCOUNTER — Other Ambulatory Visit
Admission: RE | Admit: 2020-04-25 | Discharge: 2020-04-25 | Disposition: A | Discharge status: Home / Self Care | Payer: Medicare Other | Source: Ambulatory Visit | Attending: Internal Medicine | Admitting: Internal Medicine

## 2020-04-25 DIAGNOSIS — R531 Weakness: Secondary | ICD-10-CM | POA: Diagnosis present

## 2020-04-25 LAB — COMPREHENSIVE METABOLIC PANEL
ALT: 11 U/L (ref 0–44)
AST: 16 U/L (ref 15–41)
Albumin: 3.9 g/dL (ref 3.5–5.0)
Alkaline Phosphatase: 74 U/L (ref 38–126)
Anion gap: 4 — ABNORMAL LOW (ref 5–15)
BUN: 13 mg/dL (ref 8–23)
CO2: 25 mmol/L (ref 22–32)
Calcium: 9.2 mg/dL (ref 8.9–10.3)
Chloride: 101 mmol/L (ref 98–111)
Creatinine, Ser: 1.25 mg/dL — ABNORMAL HIGH (ref 0.61–1.24)
GFR calc Af Amer: 56 mL/min — ABNORMAL LOW (ref >60)
GFR calc non Af Amer: 49 mL/min — ABNORMAL LOW (ref >60)
Glucose, Bld: 102 mg/dL — ABNORMAL HIGH (ref 70–99)
Potassium: 4.2 mmol/L (ref 3.5–5.1)
Sodium: 130 mmol/L — ABNORMAL LOW (ref 135–145)
Total Bilirubin: 1.1 mg/dL (ref 0.3–1.2)
Total Protein: 7.1 g/dL (ref 6.5–8.1)

## 2020-04-25 LAB — CBC WITH DIFFERENTIAL/PLATELET
Abs Immature Granulocytes: 0.05 10*3/uL (ref 0.00–0.07)
Basophils Absolute: 0 10*3/uL (ref 0.0–0.1)
Basophils Relative: 0 %
Eosinophils Absolute: 0.3 10*3/uL (ref 0.0–0.5)
Eosinophils Relative: 5 %
HCT: 36.8 % — ABNORMAL LOW (ref 39.0–52.0)
Hemoglobin: 12.3 g/dL — ABNORMAL LOW (ref 13.0–17.0)
Immature Granulocytes: 1 %
Lymphocytes Relative: 24 %
Lymphs Abs: 1.5 10*3/uL (ref 0.7–4.0)
MCH: 34 pg (ref 26.0–34.0)
MCHC: 33.4 g/dL (ref 30.0–36.0)
MCV: 101.7 fL — ABNORMAL HIGH (ref 80.0–100.0)
Monocytes Absolute: 0.4 10*3/uL (ref 0.1–1.0)
Monocytes Relative: 7 %
Neutro Abs: 4 10*3/uL (ref 1.7–7.7)
Neutrophils Relative %: 63 %
Platelets: 161 10*3/uL (ref 150–400)
RBC: 3.62 MIL/uL — ABNORMAL LOW (ref 4.22–5.81)
RDW: 14.6 % (ref 11.5–15.5)
WBC: 6.2 10*3/uL (ref 4.0–10.5)
nRBC: 0 % (ref 0.0–0.2)

## 2020-05-26 ENCOUNTER — Other Ambulatory Visit
Admission: RE | Admit: 2020-05-26 | Discharge: 2020-05-26 | Disposition: A | Discharge status: Home / Self Care | Payer: Medicare Other | Source: Ambulatory Visit | Attending: Internal Medicine | Admitting: Internal Medicine

## 2020-05-26 DIAGNOSIS — E860 Dehydration: Secondary | ICD-10-CM | POA: Diagnosis present

## 2020-05-26 DIAGNOSIS — R195 Other fecal abnormalities: Secondary | ICD-10-CM | POA: Diagnosis present

## 2020-05-26 LAB — CBC WITH DIFFERENTIAL/PLATELET
Abs Immature Granulocytes: 0.03 10*3/uL (ref 0.00–0.07)
Basophils Absolute: 0 10*3/uL (ref 0.0–0.1)
Basophils Relative: 0 %
Eosinophils Absolute: 0.3 10*3/uL (ref 0.0–0.5)
Eosinophils Relative: 5 %
HCT: 36.2 % — ABNORMAL LOW (ref 39.0–52.0)
Hemoglobin: 12.4 g/dL — ABNORMAL LOW (ref 13.0–17.0)
Immature Granulocytes: 1 %
Lymphocytes Relative: 16 %
Lymphs Abs: 0.9 10*3/uL (ref 0.7–4.0)
MCH: 34.3 pg — ABNORMAL HIGH (ref 26.0–34.0)
MCHC: 34.3 g/dL (ref 30.0–36.0)
MCV: 100.3 fL — ABNORMAL HIGH (ref 80.0–100.0)
Monocytes Absolute: 0.6 10*3/uL (ref 0.1–1.0)
Monocytes Relative: 10 %
Neutro Abs: 3.8 10*3/uL (ref 1.7–7.7)
Neutrophils Relative %: 68 %
Platelets: 129 10*3/uL — ABNORMAL LOW (ref 150–400)
RBC: 3.61 MIL/uL — ABNORMAL LOW (ref 4.22–5.81)
RDW: 14.4 % (ref 11.5–15.5)
WBC: 5.6 10*3/uL (ref 4.0–10.5)
nRBC: 0 % (ref 0.0–0.2)

## 2020-05-26 LAB — BASIC METABOLIC PANEL
Anion gap: 6 (ref 5–15)
BUN: 15 mg/dL (ref 8–23)
CO2: 23 mmol/L (ref 22–32)
Calcium: 9.3 mg/dL (ref 8.9–10.3)
Chloride: 104 mmol/L (ref 98–111)
Creatinine, Ser: 1.34 mg/dL — ABNORMAL HIGH (ref 0.61–1.24)
GFR calc Af Amer: 52 mL/min — ABNORMAL LOW (ref >60)
GFR calc non Af Amer: 45 mL/min — ABNORMAL LOW (ref >60)
Glucose, Bld: 109 mg/dL — ABNORMAL HIGH (ref 70–99)
Potassium: 4 mmol/L (ref 3.5–5.1)
Sodium: 133 mmol/L — ABNORMAL LOW (ref 135–145)

## 2020-07-04 ENCOUNTER — Other Ambulatory Visit
Admission: RE | Admit: 2020-07-04 | Discharge: 2020-07-04 | Disposition: A | Discharge status: Home / Self Care | Payer: Medicare Other | Source: Ambulatory Visit | Attending: Internal Medicine | Admitting: Internal Medicine

## 2020-07-04 DIAGNOSIS — M549 Dorsalgia, unspecified: Secondary | ICD-10-CM | POA: Diagnosis present

## 2020-07-04 LAB — URINALYSIS, COMPLETE (UACMP) WITH MICROSCOPIC
Bacteria, UA: NONE SEEN
Bilirubin Urine: NEGATIVE
Glucose, UA: NEGATIVE mg/dL
Hgb urine dipstick: NEGATIVE
Ketones, ur: NEGATIVE mg/dL
Leukocytes,Ua: NEGATIVE
Nitrite: NEGATIVE
Protein, ur: NEGATIVE mg/dL
Specific Gravity, Urine: 1.008 (ref 1.005–1.030)
Squamous Epithelial / HPF: NONE SEEN (ref 0–5)
pH: 5 (ref 5.0–8.0)

## 2020-07-05 LAB — URINE CULTURE: Culture: NO GROWTH

## 2020-07-10 ENCOUNTER — Other Ambulatory Visit: Payer: Self-pay | Admitting: Internal Medicine

## 2020-07-10 DIAGNOSIS — T17208A Unspecified foreign body in pharynx causing other injury, initial encounter: Secondary | ICD-10-CM

## 2020-07-10 DIAGNOSIS — R1312 Dysphagia, oropharyngeal phase: Secondary | ICD-10-CM

## 2020-07-13 ENCOUNTER — Ambulatory Visit
Admission: RE | Admit: 2020-07-13 | Discharge: 2020-07-13 | Disposition: A | Discharge status: Home / Self Care | Payer: Medicare Other | Source: Ambulatory Visit | Attending: Internal Medicine | Admitting: Internal Medicine

## 2020-07-13 ENCOUNTER — Other Ambulatory Visit: Payer: Self-pay

## 2020-07-13 DIAGNOSIS — R1312 Dysphagia, oropharyngeal phase: Secondary | ICD-10-CM | POA: Insufficient documentation

## 2020-07-13 DIAGNOSIS — R1310 Dysphagia, unspecified: Secondary | ICD-10-CM | POA: Insufficient documentation

## 2020-07-13 DIAGNOSIS — T17208A Unspecified foreign body in pharynx causing other injury, initial encounter: Secondary | ICD-10-CM | POA: Diagnosis present

## 2020-07-13 NOTE — Therapy (Addendum)
[place], [place], [address] Phone: [phone]   Fax:     Modified Barium Swallow  Patient Details  Name: Kevin Rush MRN: [id number] Date of Birth: Jun 29, 1923 No data recorded  Encounter Date: 07/13/2020   End of Session - 07/13/20 1406    Visit Number 1    Number of Visits 1    Date for SLP Re-Evaluation 07/13/20    SLP Start Time 34    SLP Stop Time  1400    SLP Time Calculation (min) 60 min    Activity Tolerance Patient tolerated treatment well           Past Medical History:  Diagnosis Date  . Actinic keratoses   . Cervicalgia   . Colon cancer ([place])    in remission  . GERD (gastroesophageal reflux disease)   . Hypertension   . Irritable bowel syndrome   . Plantar fibromatosis     Past Surgical History:  Procedure Laterality Date  . APPENDECTOMY    . CARPAL TUNNEL RELEASE Bilateral   . CHOLECYSTECTOMY    . COLON RESECTION    . COLON SURGERY    . KNEE ARTHROPLASTY Right 09/09/2016   Procedure: COMPUTER ASSISTED TOTAL KNEE ARTHROPLASTY;  Surgeon: Dereck Leep, MD;  Location: [facility] ORS;  Service: Orthopedics;  Laterality: Right;  . TONSILLECTOMY      There were no vitals filed for this visit.      Subjective: Patient behavior: (alertness, ability to follow instructions, etc.): pt A/Ox3; pleasant and talkative. Followed instructions appropriately. Chief complaint; dysphagia. Pt has a chronic acid reflux (on PPI) per referral and post-nasal drip per pt report. He is currently on a Regular diet w/ thin liquids at his living facility. Denies any trouble swallowing pills w/ water. Per referral, pt has exhibited min wet vocal quality and throat clearing in presence of mealtime, but unsure if during or after. Pt endorsed post-nasal drip intermittently. No coughing documented during oral intake/meals. No documented pneumonia or decline in pulmonary status per referral, or per pt.  No documented Neurological event/decline per chart.  OM Exam: WNL w/ no unilateral weakness noted. Speech clear.  Dentition: native w/ partials recently replaced - pt stated he had been without his partials for several months and felt he was "learning to chew again".    Objective:  Radiological Procedure: A videoflouroscopic evaluation of oral-preparatory, reflex initiation, and pharyngeal phases of the swallow was performed; as well as a screening of the upper esophageal phase.  I. POSTURE: upright II. VIEW: lateral III. COMPENSATORY STRATEGIES: time w/ each trial for bolus piecemeal and clearing IV. BOLUSES ADMINISTERED:  Thin Liquid: multiple sips; 3 single sips trials  Nectar-thick Liquid: 1 trial  Honey-thick Liquid: NT  Puree: 3 trials  Mechanical Soft: 3 trials (moistened lightly w/ puree) V. RESULTS OF EVALUATION: A. ORAL PREPARATORY PHASE: (The lips, tongue, and velum are observed for strength and coordination)       **Overall Severity Rating: grossly WFL. Bolus piecemealing noted w/ increased textured foods which pt attended to and cleared w/ f/u swallow(s). Min increased mastication time w/ the increased textured trials but functional as pt cleared orally w/ each trial. Pt stated he often moistened the solid foods w/ a sip of liquids ("tea") which softened the food more for swallowing/clearing.   B. SWALLOW INITIATION/REFLEX: (The reflex is normal if "triggered" by the time the bolus reached the base of the tongue)  **Overall Severity  Rating: WFL.   C. PHARYNGEAL PHASE: (Pharyngeal function is normal if the bolus shows rapid, smooth, and continuous transit through the pharynx and there is no pharyngeal residue after the swallow)  **Overall Severity Rating: [facility].  D. LARYNGEAL PENETRATION: (Material entering into the laryngeal inlet/vestibule but not aspirated): NONE E. ASPIRATION: NONE F. ESOPHAGEAL PHASE: (Screening of the upper esophagus): no deficits noted in the viewable  cervical esophagus. Bolus motility appeared WFL through the UES and cervical esophagus.   ASSESSMENT:  Pt appears to present w/ functional oropharyngeal phase swallow function; No gross oropharyngeal phase dysphagia; No neuromuscular deficits of swallowing. No aspiration or penetration of po trials was noted to occur during this study. No overt Esophageal dysmotility viewable as bolus trials passed into/through the Cervical Esophagus. Pt exhibited Belching post po trials/study in preparation to leave. Pt does have Baseline of Reflux behavior. Many of his reported s/s are consistent with REFLUX/GERD (post-nasal drip, phlegm during/after meals which can incite a throat clear). During the oral phase, oral prep and oral phase were grossly WFL. Bolus piecemealing was noted w/ increased textured foods which pt attended to and cleared w/ f/u swallow(s). Min increased mastication time w/ the increased textured trials but functional as pt cleared orally w/ each trial. Pt stated he often moistened solid foods w/ a sip of liquid ("tea") which softened the food for swallowing/clearing. Timely bolus management and control of bolus propulsion for A-P transfer occurred w/ liquids, purees, and initial swallow of soft-solid. During the pharyngeal phase, Timely pharyngeal swallow initiation noted w/ all trial consistencies. No aspiration or penetration occurred; airway closure appeared timely, tight. No pharyngeal residue remained post swallow indicating adequate laryngeal excursion and pharyngeal pressure during the swallow.  Discussed results of MBSS, video viewed and questions answered. Recommended f/u w/ GI for ongoing assessment, management of REFLUX/GERD per pt's c/o of issues. Recommend softened, moist foods and alternating sips w/ bites (if desired) for ease of oral phase/clearing.    PLAN/RECOMMENDATIONS:  A. Diet: Regular diet w/ moistened foods, cut/small bites; Thin liquids.   B. Swallowing Precautions: general  aspiration precautions; REFLUX precautions.   C. Recommended consultation to: GI re: REFLUX/GERD, PPI   D. Therapy recommendations: education on general aspiration precautions; REFLUX precautions and the effects of REFLUX including post-nasal drip and increased phlegm during meals.  E. Results and recommendations were discussed w/ pt; video viewed during/after; questions answered. Recommended exploring foods that create increased difficulty chewing ("linked sausage") and explore options of such and/or ways to break down/moisten the food. Regarding issue of "linked sausage", recommend Patty Sausage or Berniece Salines in its place if pt finds those foods/form easier for chewing.               Dysphagia, unspecified type  Oropharyngeal dysphagia - Plan: DG SWALLOW FUNC OP MEDICARE SPEECH PATH, DG SWALLOW FUNC OP MEDICARE SPEECH PATH          Problem List Patient Active Problem List   Diagnosis Date Noted  . Hypotension 08/11/2017  . 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 Irritable bowel syndrome 09/09/2016  . Hypertension 09/09/2016  . History of colon cancer 09/09/2016  . Actinic keratoses 09/09/2016  . S/P total knee arthroplasty 09/09/2016  . Pulmonary fibrosis ([place]) 01/11/2016  . Bronchitis 09/25/2015  . Left optic neuritis 06/14/2015  . B12 deficiency 02/21/2015  . Tremor 03/04/2014  . Vertigo 02/21/2014  . Renal insufficiency 02/21/2014  . TIA (transient ischemic attack) 01/07/2014  . Squamous cell carcinoma of skin of face 06/24/2013  Orinda Kenner, MS, CCC-SLP Speech Language Pathologist Rehab Services [phone] [facility] 07/13/2020, 2:08 PM  [facility] [place], [place], [address] Phone: [phone]   Fax:     Name: Kevin Rush MRN: [id number] Date of Birth: 1923-06-30

## 2021-03-09 ENCOUNTER — Other Ambulatory Visit: Payer: Self-pay | Admitting: Family Medicine

## 2021-03-09 DIAGNOSIS — M4856XA Collapsed vertebra, not elsewhere classified, lumbar region, initial encounter for fracture: Secondary | ICD-10-CM

## 2021-03-11 ENCOUNTER — Ambulatory Visit
Admission: RE | Admit: 2021-03-11 | Discharge: 2021-03-11 | Disposition: A | Discharge status: Home / Self Care | Payer: Medicare Other | Source: Ambulatory Visit | Attending: Family Medicine | Admitting: Family Medicine

## 2021-03-11 DIAGNOSIS — M4856XA Collapsed vertebra, not elsewhere classified, lumbar region, initial encounter for fracture: Secondary | ICD-10-CM | POA: Diagnosis present

## 2022-08-08 ENCOUNTER — Other Ambulatory Visit: Payer: Self-pay

## 2022-08-08 ENCOUNTER — Emergency Department
Admission: EM | Admit: 2022-08-08 | Discharge: 2022-08-08 | Discharge status: Against Medical Advice | Payer: Medicare Other | Attending: Emergency Medicine | Admitting: Emergency Medicine

## 2022-08-08 DIAGNOSIS — R110 Nausea: Secondary | ICD-10-CM | POA: Diagnosis not present

## 2022-08-08 DIAGNOSIS — Z5321 Procedure and treatment not carried out due to patient leaving prior to being seen by health care provider: Secondary | ICD-10-CM | POA: Diagnosis not present

## 2022-08-08 DIAGNOSIS — R1030 Lower abdominal pain, unspecified: Secondary | ICD-10-CM | POA: Insufficient documentation

## 2022-08-08 LAB — COMPREHENSIVE METABOLIC PANEL
ALT: 13 U/L (ref 0–44)
AST: 23 U/L (ref 15–41)
Albumin: 3.5 g/dL (ref 3.5–5.0)
Alkaline Phosphatase: 61 U/L (ref 38–126)
Anion gap: 8 (ref 5–15)
BUN: 17 mg/dL (ref 8–23)
CO2: 22 mmol/L (ref 22–32)
Calcium: 9.4 mg/dL (ref 8.9–10.3)
Chloride: 102 mmol/L (ref 98–111)
Creatinine, Ser: 1.27 mg/dL — ABNORMAL HIGH (ref 0.61–1.24)
GFR, Estimated: 51 mL/min — ABNORMAL LOW (ref >60)
Glucose, Bld: 108 mg/dL — ABNORMAL HIGH (ref 70–99)
Potassium: 4.7 mmol/L (ref 3.5–5.1)
Sodium: 132 mmol/L — ABNORMAL LOW (ref 135–145)
Total Bilirubin: 0.9 mg/dL (ref 0.3–1.2)
Total Protein: 7 g/dL (ref 6.5–8.1)

## 2022-08-08 LAB — CBC
HCT: 40.6 % (ref 39.0–52.0)
Hemoglobin: 13.3 g/dL (ref 13.0–17.0)
MCH: 34.3 pg — ABNORMAL HIGH (ref 26.0–34.0)
MCHC: 32.8 g/dL (ref 30.0–36.0)
MCV: 104.6 fL — ABNORMAL HIGH (ref 80.0–100.0)
Platelets: 143 10*3/uL — ABNORMAL LOW (ref 150–400)
RBC: 3.88 MIL/uL — ABNORMAL LOW (ref 4.22–5.81)
RDW: 14.9 % (ref 11.5–15.5)
WBC: 5.6 10*3/uL (ref 4.0–10.5)
nRBC: 0 % (ref 0.0–0.2)

## 2022-08-08 LAB — LIPASE, BLOOD: Lipase: 38 U/L (ref 11–51)

## 2022-08-08 NOTE — ED Triage Notes (Addendum)
[place] comes [facility] [place] via acems c/o abd pain. Pt states he has been having lower quadrant pain for about 2 days. Pt also reports being nauseous but has not vomited. Last BM was last night. Hx of colon cancer, states he does not have appendix or gallbladder. Denies CP, SOB

## 2022-12-13 ENCOUNTER — Encounter: Payer: Self-pay | Admitting: Intensive Care

## 2022-12-13 ENCOUNTER — Emergency Department: Payer: Medicare Other

## 2022-12-13 ENCOUNTER — Other Ambulatory Visit: Payer: Self-pay

## 2022-12-13 ENCOUNTER — Emergency Department
Admission: EM | Admit: 2022-12-13 | Discharge: 2022-12-13 | Disposition: A | Discharge status: Home / Self Care | Payer: Medicare Other | Attending: Emergency Medicine | Admitting: Emergency Medicine

## 2022-12-13 DIAGNOSIS — N189 Chronic kidney disease, unspecified: Secondary | ICD-10-CM | POA: Insufficient documentation

## 2022-12-13 DIAGNOSIS — I129 Hypertensive chronic kidney disease with stage 1 through stage 4 chronic kidney disease, or unspecified chronic kidney disease: Secondary | ICD-10-CM | POA: Diagnosis not present

## 2022-12-13 DIAGNOSIS — Z85038 Personal history of other malignant neoplasm of large intestine: Secondary | ICD-10-CM | POA: Diagnosis not present

## 2022-12-13 DIAGNOSIS — R0789 Other chest pain: Secondary | ICD-10-CM | POA: Insufficient documentation

## 2022-12-13 DIAGNOSIS — R079 Chest pain, unspecified: Secondary | ICD-10-CM

## 2022-12-13 LAB — BASIC METABOLIC PANEL
Anion gap: 6 (ref 5–15)
BUN: 23 mg/dL (ref 8–23)
CO2: 26 mmol/L (ref 22–32)
Calcium: 9.4 mg/dL (ref 8.9–10.3)
Chloride: 100 mmol/L (ref 98–111)
Creatinine, Ser: 1.1 mg/dL (ref 0.61–1.24)
GFR, Estimated: >60 mL/min (ref >60)
Glucose, Bld: 85 mg/dL (ref 70–99)
Potassium: 4.1 mmol/L (ref 3.5–5.1)
Sodium: 132 mmol/L — ABNORMAL LOW (ref 135–145)

## 2022-12-13 LAB — PROTIME-INR
INR: 1 (ref 0.8–1.2)
Prothrombin Time: 13.2 seconds (ref 11.4–15.2)

## 2022-12-13 LAB — CBC
HCT: 41.5 % (ref 39.0–52.0)
Hemoglobin: 13.3 g/dL (ref 13.0–17.0)
MCH: 34.5 pg — ABNORMAL HIGH (ref 26.0–34.0)
MCHC: 32 g/dL (ref 30.0–36.0)
MCV: 107.5 fL — ABNORMAL HIGH (ref 80.0–100.0)
Platelets: 139 10*3/uL — ABNORMAL LOW (ref 150–400)
RBC: 3.86 MIL/uL — ABNORMAL LOW (ref 4.22–5.81)
RDW: 14.8 % (ref 11.5–15.5)
WBC: 9.8 10*3/uL (ref 4.0–10.5)
nRBC: 0 % (ref 0.0–0.2)

## 2022-12-13 LAB — TROPONIN I (HIGH SENSITIVITY)
Troponin I (High Sensitivity): 6 ng/L (ref <18)
Troponin I (High Sensitivity): 7 ng/L (ref <18)

## 2022-12-13 NOTE — ED Triage Notes (Signed)
Patient arrived by EMS from [facility] for CP that started today and lasted 20 minutes then subsided. Denies SOB or CP upon arrival.  C/o dull head pain and states "my head and vision feels funny." Patient denies tunnel vision, blurry vision, or vision loss.   EMS administered: 324 aspirin   EMS vitals: 155/91 66HR 97% RA

## 2022-12-13 NOTE — ED Provider Notes (Signed)
[facility] Provider Note    Event Date/Time   First MD Initiated Contact with Patient 12/13/22 1525     (approximate)   History   Chief Complaint Chest Pain   HPI  JAQUALIN SERPA is a 89 y.o. male with past medical history of hypertension, colon cancer, CKD, and GERD who presents to the ED complaining of chest pain.  Patient reports that earlier this morning while he was sitting and playing cards on his computer he began to notice a heavy discomfort in the center of his chest.  He states this radiated up towards his head and down his right arm, lasted for about 15 minutes before resolving.  He denies any associated difficulty breathing and has not had any fever or cough.  He has not noticed any pain or swelling in his legs, states he is now feeling back to normal.  Daughter does report that patient recently treated with course of steroids and antibiotics for "respiratory infection," but the symptoms have seemed to resolve.     Physical Exam   Triage Vital Signs: ED Triage Vitals  Enc Vitals Group     BP 12/13/22 1238 (!) 156/78     Pulse Rate 12/13/22 1238 (!) 110     Resp 12/13/22 1238 18     Temp 12/13/22 1238 98.2 F (36.8 C)     Temp Source 12/13/22 1238 Oral     SpO2 12/13/22 1238 100 %     Weight 12/13/22 1239 140 lb (63.5 kg)     Height --      Head Circumference --      Peak Flow --      Pain Score 12/13/22 1238 0     Pain Loc --      Pain Edu? --      Excl. in GC? --     Most recent vital signs: Vitals:   12/13/22 1238  BP: (!) 156/78  Pulse: (!) 110  Resp: 18  Temp: 98.2 F (36.8 C)  SpO2: 100%    Constitutional: Alert and oriented. Eyes: Conjunctivae are normal. Head: Atraumatic. Nose: No congestion/rhinnorhea. Mouth/Throat: Mucous membranes are moist.  Cardiovascular: Normal rate, regular rhythm. Grossly normal heart sounds.  2+ radial pulses bilaterally. Respiratory: Normal respiratory effort.  No retractions. Lungs  CTAB.  No chest wall tenderness to palpation noted. Gastrointestinal: Soft and nontender. No distention. Musculoskeletal: No lower extremity tenderness nor edema.  Neurologic:  Normal speech and language. No gross focal neurologic deficits are appreciated.    ED Results / Procedures / Treatments   Labs (all labs ordered are listed, but only abnormal results are displayed) Labs Reviewed  BASIC METABOLIC PANEL - Abnormal; Notable for the following components:      Result Value   Sodium 132 (*)    All other components within normal limits  CBC - Abnormal; Notable for the following components:   RBC 3.86 (*)    MCV 107.5 (*)    MCH 34.5 (*)    Platelets 139 (*)    All other components within normal limits  PROTIME-INR  TROPONIN I (HIGH SENSITIVITY)  TROPONIN I (HIGH SENSITIVITY)     EKG  ED ECG REPORT I, Chesley Noon, the attending physician, personally viewed and interpreted this ECG.   Date: 12/13/2022  EKG Time: 12:36  Rate: 72  Rhythm: normal sinus rhythm, PAC's noted  Axis: Normal  Intervals:first-degree A-V block   ST&T Change: None  RADIOLOGY Chest x-ray reviewed and  interpreted by me with no infiltrate or edema noted, trace right effusion noted.  PROCEDURES:  Critical Care performed: No  Procedures   MEDICATIONS ORDERED IN ED: Medications - No data to display   IMPRESSION / MDM / ASSESSMENT AND PLAN / ED COURSE  I reviewed the triage vital signs and the nursing notes.                              89 y.o. male with past medical history of hypertension, GERD, colon cancer, and CKD who presents to the ED complaining of acute onset chest heaviness earlier this morning radiating towards his head and right arm, since resolved.  Patient's presentation is most consistent with acute presentation with potential threat to life or bodily function.  Differential diagnosis includes, but is not limited to, ACS, PE, dissection, pneumonia, pneumothorax, GERD,  musculoskeletal pain, anxiety, anemia, electrolyte abnormality.  Patient nontoxic-appearing and in no acute distress, vital signs remarkable for tachycardia but otherwise reassuring.  EKG shows sinus rhythm with frequent PACs, no ischemic changes noted.  Troponin within normal limits and symptoms seem atypical for ACS, will check repeat troponin but if this is negative I doubt ACS.  I doubt PE or dissection given resolution of pain.  Remainder of labs are reassuring with no significant anemia, leukocytosis, tract abnormality, or AKI.  Chest x-ray does show hazy bibasilar opacities, atelectasis favored per radiology and I have low suspicion for pneumonia, may be residual findings from patient's recent "respiratory infection" that was treated.  Do not feel additional round of antibiotics indicated at this time.  Repeat troponin within normal limits, patient remains asymptomatic here in the ED and is appropriate for discharge home with outpatient follow-up.  He was counseled to return to the ED for new or worsening symptoms, patient agrees with plan.      FINAL CLINICAL IMPRESSION(S) / ED DIAGNOSES   Final diagnoses:  Nonspecific chest pain     Rx / DC Orders   ED Discharge Orders     None        Note:  This document was prepared using Dragon voice recognition software and may include unintentional dictation errors.   Chesley Noon, MD 12/13/22 1630

## 2022-12-13 NOTE — ED Notes (Signed)
Patient transported to X-ray 

## 2023-05-18 ENCOUNTER — Emergency Department: Payer: Medicare Other

## 2023-05-18 ENCOUNTER — Emergency Department
Admission: EM | Admit: 2023-05-18 | Discharge: 2023-05-18 | Disposition: A | Discharge status: Home / Self Care | Payer: Medicare Other | Attending: Emergency Medicine | Admitting: Emergency Medicine

## 2023-05-18 ENCOUNTER — Other Ambulatory Visit: Payer: Self-pay

## 2023-05-18 DIAGNOSIS — N189 Chronic kidney disease, unspecified: Secondary | ICD-10-CM | POA: Diagnosis not present

## 2023-05-18 DIAGNOSIS — K529 Noninfective gastroenteritis and colitis, unspecified: Secondary | ICD-10-CM | POA: Diagnosis not present

## 2023-05-18 DIAGNOSIS — I129 Hypertensive chronic kidney disease with stage 1 through stage 4 chronic kidney disease, or unspecified chronic kidney disease: Secondary | ICD-10-CM | POA: Diagnosis not present

## 2023-05-18 DIAGNOSIS — R112 Nausea with vomiting, unspecified: Secondary | ICD-10-CM | POA: Diagnosis present

## 2023-05-18 DIAGNOSIS — Z85038 Personal history of other malignant neoplasm of large intestine: Secondary | ICD-10-CM | POA: Diagnosis not present

## 2023-05-18 LAB — URINALYSIS, ROUTINE W REFLEX MICROSCOPIC
Bilirubin Urine: NEGATIVE
Glucose, UA: NEGATIVE mg/dL
Hgb urine dipstick: NEGATIVE
Ketones, ur: NEGATIVE mg/dL
Leukocytes,Ua: NEGATIVE
Nitrite: NEGATIVE
Protein, ur: NEGATIVE mg/dL
Specific Gravity, Urine: 1.028 (ref 1.005–1.030)
pH: 5 (ref 5.0–8.0)

## 2023-05-18 LAB — HEPATIC FUNCTION PANEL
ALT: 17 U/L (ref 0–44)
AST: 27 U/L (ref 15–41)
Albumin: 3.5 g/dL (ref 3.5–5.0)
Alkaline Phosphatase: 71 U/L (ref 38–126)
Bilirubin, Direct: 0.2 mg/dL (ref 0.0–0.2)
Indirect Bilirubin: 0.9 mg/dL (ref 0.3–0.9)
Total Bilirubin: 1.1 mg/dL (ref 0.3–1.2)
Total Protein: 6.9 g/dL (ref 6.5–8.1)

## 2023-05-18 LAB — CBC
HCT: 40.6 % (ref 39.0–52.0)
Hemoglobin: 13.6 g/dL (ref 13.0–17.0)
MCH: 34.8 pg — ABNORMAL HIGH (ref 26.0–34.0)
MCHC: 33.5 g/dL (ref 30.0–36.0)
MCV: 103.8 fL — ABNORMAL HIGH (ref 80.0–100.0)
Platelets: 158 10*3/uL (ref 150–400)
RBC: 3.91 MIL/uL — ABNORMAL LOW (ref 4.22–5.81)
RDW: 14.9 % (ref 11.5–15.5)
WBC: 6.6 10*3/uL (ref 4.0–10.5)
nRBC: 0 % (ref 0.0–0.2)

## 2023-05-18 LAB — BASIC METABOLIC PANEL
Anion gap: 12 (ref 5–15)
BUN: 16 mg/dL (ref 8–23)
CO2: 23 mmol/L (ref 22–32)
Calcium: 9.5 mg/dL (ref 8.9–10.3)
Chloride: 96 mmol/L — ABNORMAL LOW (ref 98–111)
Creatinine, Ser: 1.26 mg/dL — ABNORMAL HIGH (ref 0.61–1.24)
GFR, Estimated: 52 mL/min — ABNORMAL LOW (ref >60)
Glucose, Bld: 118 mg/dL — ABNORMAL HIGH (ref 70–99)
Potassium: 4.2 mmol/L (ref 3.5–5.1)
Sodium: 131 mmol/L — ABNORMAL LOW (ref 135–145)

## 2023-05-18 LAB — LIPASE, BLOOD: Lipase: 27 U/L (ref 11–51)

## 2023-05-18 MED ORDER — ONDANSETRON HCL 4 MG/2ML IJ SOLN
4.0000 mg | Freq: Once | INTRAMUSCULAR | Status: AC
  Administered 2023-05-18: 4 mg via INTRAVENOUS
  Filled 2023-05-18: qty 2

## 2023-05-18 MED ORDER — SODIUM CHLORIDE 0.9 % IV BOLUS
500.0000 mL | Freq: Once | INTRAVENOUS | Status: AC
  Administered 2023-05-18: 500 mL via INTRAVENOUS

## 2023-05-18 MED ORDER — IOHEXOL 300 MG/ML  SOLN
100.0000 mL | Freq: Once | INTRAMUSCULAR | Status: AC | PRN
  Administered 2023-05-18: 100 mL via INTRAVENOUS

## 2023-05-18 NOTE — Discharge Instructions (Signed)
You may continue to take Zofran up to every 8 hours as needed for nausea.  Gradually advance your diet.  Return to the ER for new, worsening, or persistent severe nausea or vomiting, abdominal pain, fever, weakness, blood in the stool, or any other new or worsening symptoms that concern you.

## 2023-05-18 NOTE — ED Triage Notes (Signed)
Pt comes via EMS from [facility] with c/o V/D and belly pain for 3 days. Pt also states dizziness. VSS per EMS

## 2023-05-18 NOTE — ED Provider Notes (Signed)
[facility] Provider Note    Event Date/Time   First MD Initiated Contact with Patient 05/18/23 1738     (approximate)   History   Dizziness   HPI  Kevin Rush is a 89 y.o. male with a history of hypertension, CKD, colon cancer, and GERD who presents with nausea and vomiting for last 3 days, persistent course, not associated with any acute abdominal pain.  The patient has had some loose stools as well.  He denies any fever or chills.  He has no sick contacts.  I reviewed the past medical records.  The patient's most recent outpatient encounter was with physical medicine on 6/27 for eval ration of low back pain.   Physical Exam   Triage Vital Signs: ED Triage Vitals [05/18/23 1730]  Encounter Vitals Group     BP      Systolic BP Percentile      Diastolic BP Percentile      Pulse      Resp      Temp      Temp src      SpO2      Weight      Height      Head Circumference      Peak Flow      Pain Score 5     Pain Loc      Pain Education      Exclude from Growth Chart     Most recent vital signs: Vitals:   05/18/23 1855 05/18/23 2007  BP: 120/72   Pulse: 72   Resp: 20   Temp:  98.2 F (36.8 C)  SpO2: 93%     General: Awake, no distress.  CV:  Good peripheral perfusion.  Resp:  Normal effort.  Abd:  No distention.  Mild diffuse abdominal tenderness. Other:  No jaundice or scleral icterus.  Slightly dry mucous membranes.   ED Results / Procedures / Treatments   Labs (all labs ordered are listed, but only abnormal results are displayed) Labs Reviewed  BASIC METABOLIC PANEL - Abnormal; Notable for the following components:      Result Value   Sodium 131 (*)    Chloride 96 (*)    Glucose, Bld 118 (*)    Creatinine, Ser 1.26 (*)    GFR, Estimated 52 (*)    All other components within normal limits  CBC - Abnormal; Notable for the following components:   RBC 3.91 (*)    MCV 103.8 (*)    MCH 34.8 (*)    All other components  within normal limits  URINALYSIS, ROUTINE W REFLEX MICROSCOPIC - Abnormal; Notable for the following components:   Color, Urine YELLOW (*)    APPearance CLEAR (*)    All other components within normal limits  HEPATIC FUNCTION PANEL  LIPASE, BLOOD  CBG MONITORING, ED     EKG  ED ECG REPORT I, Dionne Bucy, the attending physician, personally viewed and interpreted this ECG.  Date: 05/18/2023 EKG Time: 1732 Rate: 81 Rhythm: normal sinus rhythm QRS Axis: normal Intervals: LPFB ST/T Wave abnormalities: Nonspecific T wave abnormalities Narrative Interpretation: no evidence of acute ischemia    RADIOLOGY  CT abdomen/pelvis: I independently viewed and interpreted the images; there are no dilated bowel loops or any free air or free fluid.  Radiology report indicates fluid-filled small bowel consistent with enteritis.   PROCEDURES:  Critical Care performed: No  Procedures   MEDICATIONS ORDERED IN ED: Medications  sodium  chloride 0.9 % bolus 500 mL (0 mLs Intravenous Stopped 05/18/23 2013)  ondansetron (ZOFRAN) injection 4 mg (4 mg Intravenous Given 05/18/23 1848)  iohexol (OMNIPAQUE) 300 MG/ML solution 100 mL (100 mLs Intravenous Contrast Given 05/18/23 1903)     IMPRESSION / MDM / ASSESSMENT AND PLAN / ED COURSE  I reviewed the triage vital signs and the nursing notes.  89 year old male with PMH as noted above presents with nausea and vomiting for the last several days associated with loose stools.  He has no acute abdominal pain although does have mild tenderness on exam.  His vital signs are normal and he is overall well-appearing.  Differential diagnosis includes, but is not limited to, gastroenteritis, colitis, diverticulitis, gastritis, gastroparesis.  Will obtain lab workup.  Given the patient's age and the discomfort on exam we will obtain CT for further evaluation.  Patient's presentation is most consistent with acute complicated illness / injury requiring  diagnostic workup.  ----------------------------------------- 9:32 PM on 05/18/2023 -----------------------------------------  CT shows evidence of enteritis but no other concerning acute findings.  Lab workup is reassuring.  There is no leukocytosis or anemia.  Electrolytes are normal.  LFTs and lipase are normal.  Urinalysis is negative.  The patient has not had any further vomiting in the ED.  He is now tolerating p.o.  He states he is feeling better.  I counseled the patient and his daughter on the results of the workup.  They both feel comfortable with him going back to his facility.  He has been receiving Zofran there, so does not require a prescription.  I gave strict return precautions and they expressed understanding.   FINAL CLINICAL IMPRESSION(S) / ED DIAGNOSES   Final diagnoses:  Gastroenteritis     Rx / DC Orders   ED Discharge Orders     None        Note:  This document was prepared using Dragon voice recognition software and may include unintentional dictation errors.    Dionne Bucy, MD 05/18/23 2133

## 2023-05-18 NOTE — ED Notes (Signed)
Report was given to [facility], pt was transported to the facility via daughter

## 2023-05-18 NOTE — ED Notes (Signed)
Pt passed PO challenge.

## 2023-12-11 ENCOUNTER — Other Ambulatory Visit: Payer: Self-pay | Admitting: Ophthalmology

## 2023-12-11 ENCOUNTER — Other Ambulatory Visit
Admission: RE | Admit: 2023-12-11 | Discharge: 2023-12-11 | Disposition: A | Discharge status: Home / Self Care | Source: Ambulatory Visit | Attending: Ophthalmology | Admitting: Ophthalmology

## 2023-12-11 DIAGNOSIS — H3412 Central retinal artery occlusion, left eye: Secondary | ICD-10-CM | POA: Insufficient documentation

## 2023-12-11 LAB — CBC
HCT: 33.9 % — ABNORMAL LOW (ref 39.0–52.0)
Hemoglobin: 11.3 g/dL — ABNORMAL LOW (ref 13.0–17.0)
MCH: 34.6 pg — ABNORMAL HIGH (ref 26.0–34.0)
MCHC: 33.3 g/dL (ref 30.0–36.0)
MCV: 103.7 fL — ABNORMAL HIGH (ref 80.0–100.0)
Platelets: 134 10*3/uL — ABNORMAL LOW (ref 150–400)
RBC: 3.27 MIL/uL — ABNORMAL LOW (ref 4.22–5.81)
RDW: 15.8 % — ABNORMAL HIGH (ref 11.5–15.5)
WBC: 5.9 10*3/uL (ref 4.0–10.5)
nRBC: 0 % (ref 0.0–0.2)

## 2023-12-11 LAB — C-REACTIVE PROTEIN: CRP: 1.1 mg/dL — ABNORMAL HIGH (ref <1.0)

## 2023-12-11 LAB — SEDIMENTATION RATE: Sed Rate: 37 mm/h — ABNORMAL HIGH (ref 0–20)

## 2023-12-16 ENCOUNTER — Ambulatory Visit
Admission: RE | Admit: 2023-12-16 | Discharge: 2023-12-16 | Disposition: A | Discharge status: Home / Self Care | Source: Ambulatory Visit | Attending: Ophthalmology | Admitting: Ophthalmology

## 2023-12-16 DIAGNOSIS — H3412 Central retinal artery occlusion, left eye: Secondary | ICD-10-CM | POA: Diagnosis present
